# Patient Record
Sex: Male | Born: 1985 | Race: Black or African American | Hispanic: No | Marital: Single | Smoking: Never smoker
Health system: Southern US, Community
[De-identification: ages and names within clinical notes are randomized; demographics above are authoritative.]

## PROBLEM LIST (undated history)

## (undated) DIAGNOSIS — I10 Essential (primary) hypertension: Secondary | ICD-10-CM

## (undated) DIAGNOSIS — F909 Attention-deficit hyperactivity disorder, unspecified type: Secondary | ICD-10-CM

## (undated) DIAGNOSIS — Q992 Fragile X chromosome: Secondary | ICD-10-CM

## (undated) DIAGNOSIS — T7840XA Allergy, unspecified, initial encounter: Secondary | ICD-10-CM

## (undated) HISTORY — DX: Allergy, unspecified, initial encounter: T78.40XA

## (undated) HISTORY — DX: Fragile x chromosome: Q99.2

## (undated) HISTORY — DX: Attention-deficit hyperactivity disorder, unspecified type: F90.9

## (undated) HISTORY — DX: Essential (primary) hypertension: I10

## (undated) HISTORY — PX: NO PAST SURGERIES: SHX2092

---

## 2011-11-22 ENCOUNTER — Ambulatory Visit: Payer: Self-pay | Admitting: Internal Medicine

## 2012-10-15 IMAGING — CR LEFT MIDDLE FINGER 2+V
1 series · 3 of 3 positions shown · non-contrast
Comparison: none

REASON FOR EXAM: swollen distal after [REDACTED] into it yesteday
COMMENTS:

PROCEDURE:     MDR - MDR FINGER MID 3RD DIGIT LT HA  - November 22, 2011  [DATE]
RESULT:     Images of the left third finger demonstrate no definite
fracture, dislocation or foreign body.

[Series 1: pa · 0.17mm/px · 3 of 3 slices shown]
[im 1/3]
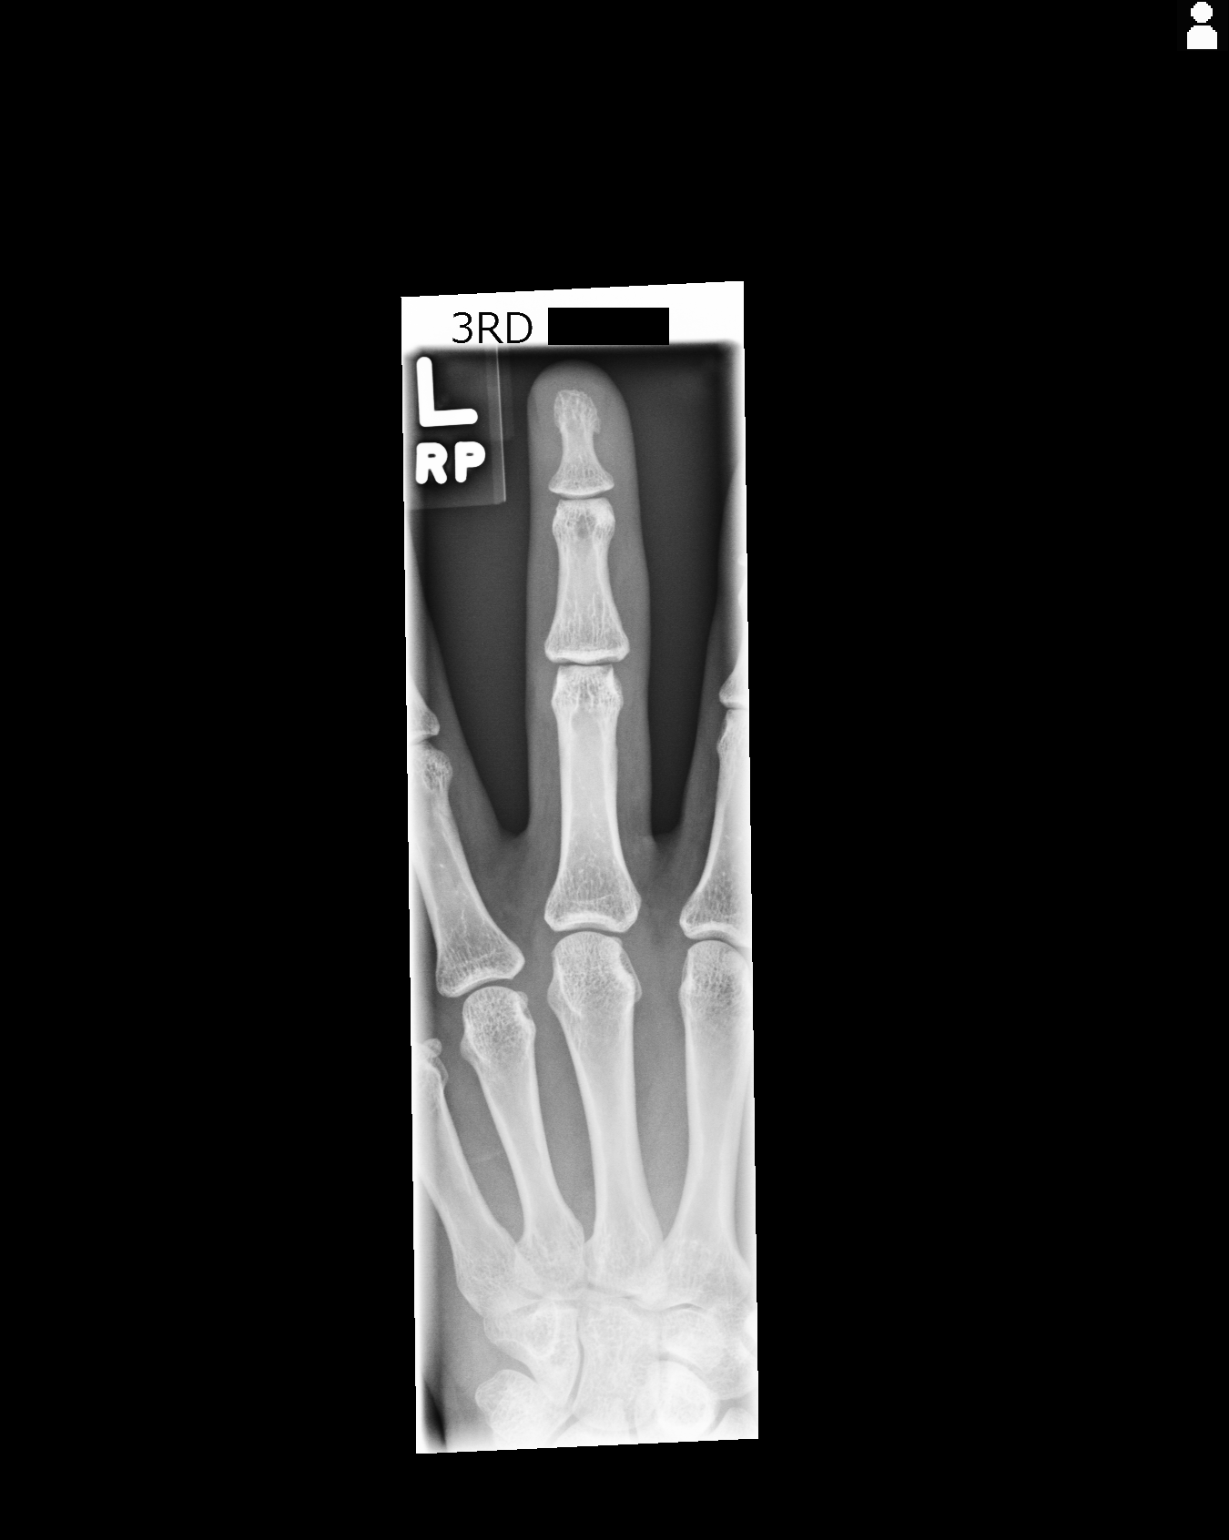
[im 2/3]
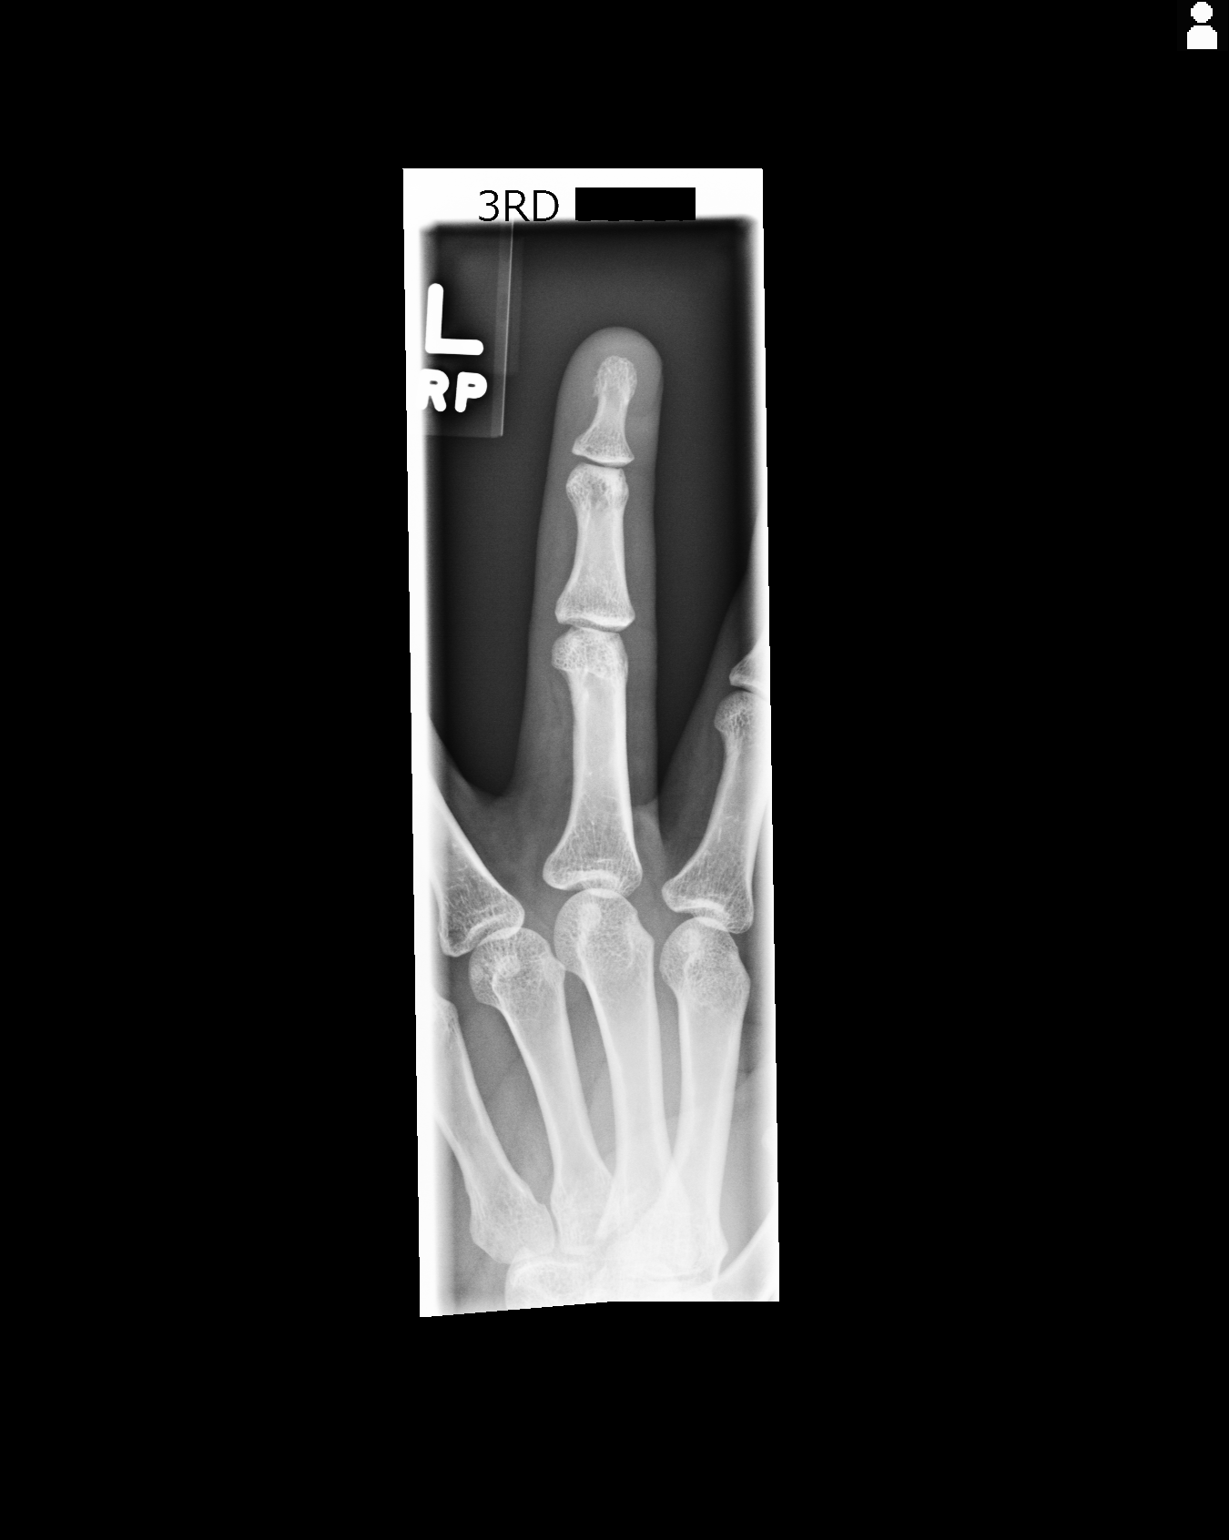
[im 3/3]
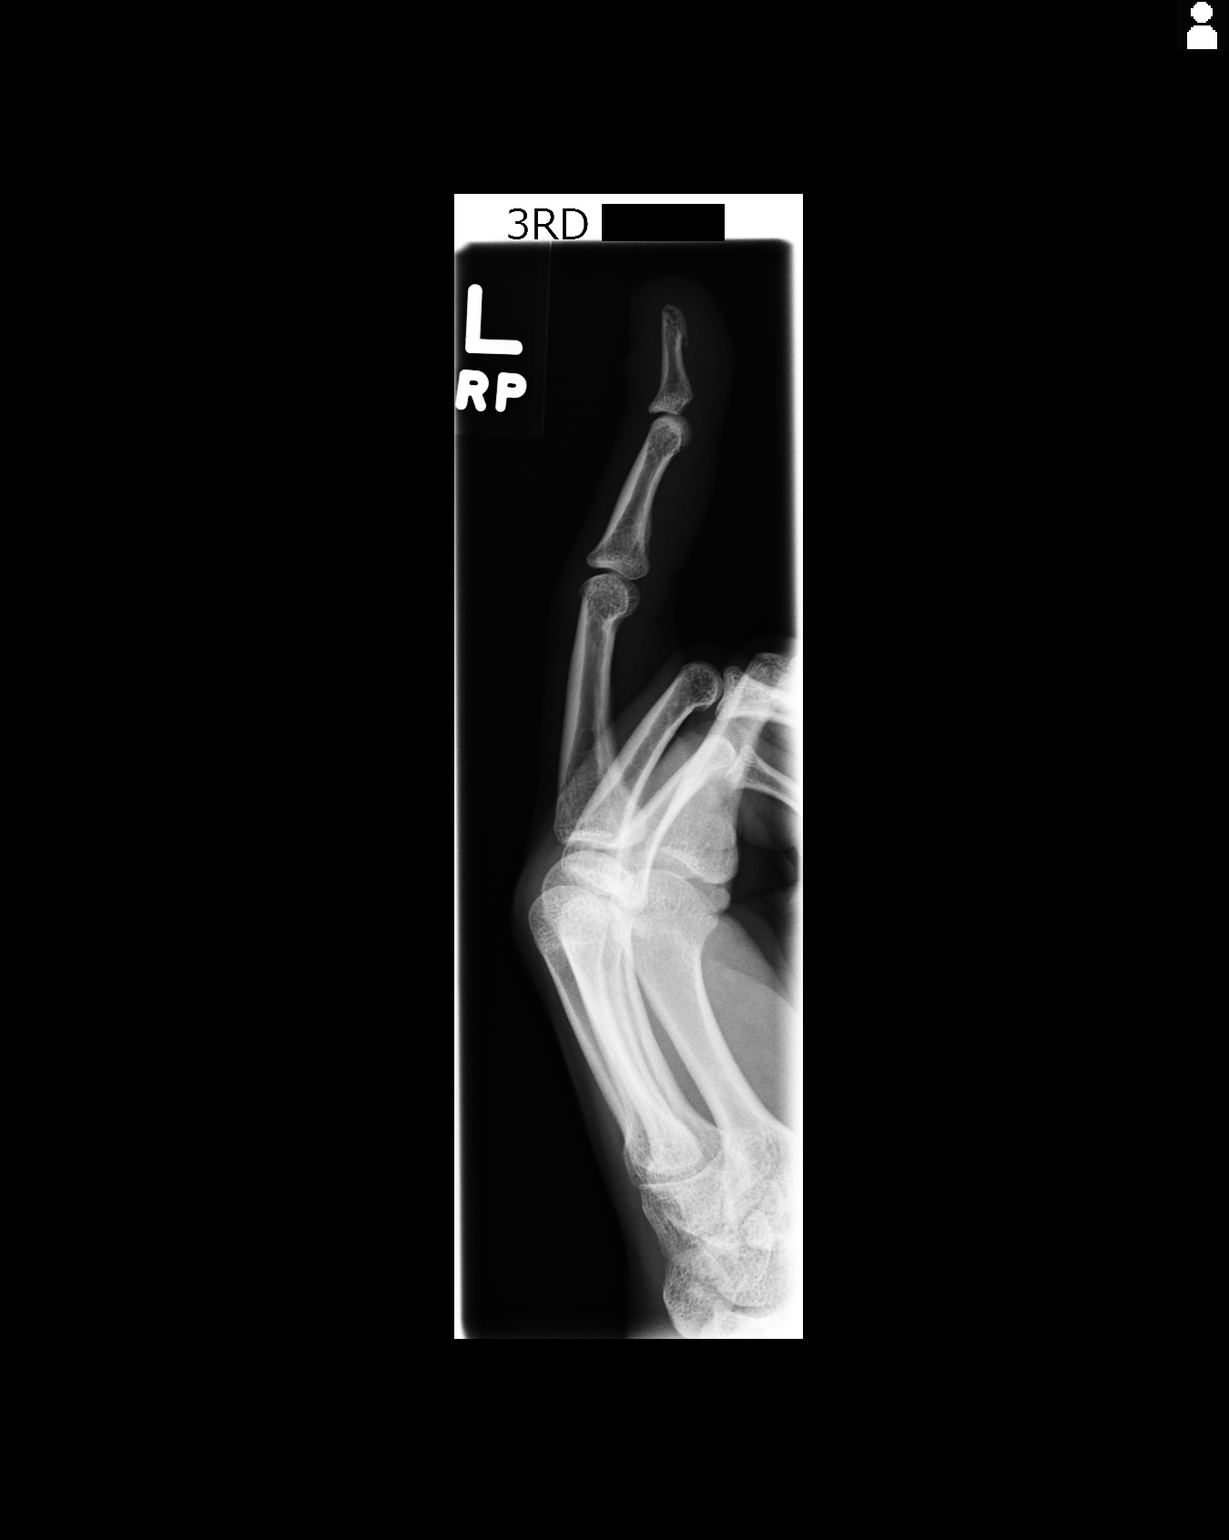

[3 of 3 positions shown; findings below may reference images not displayed]

IMPRESSION: No acute bony abnormality evident.

## 2017-12-25 ENCOUNTER — Other Ambulatory Visit: Payer: Self-pay

## 2017-12-25 ENCOUNTER — Ambulatory Visit: Payer: Medicaid Other | Admitting: Nurse Practitioner

## 2017-12-25 ENCOUNTER — Encounter: Payer: Self-pay | Admitting: Nurse Practitioner

## 2017-12-25 VITALS — BP 120/78 | HR 68 | Ht 69.0 in | Wt 156.2 lb

## 2017-12-25 DIAGNOSIS — Q992 Fragile X chromosome: Secondary | ICD-10-CM

## 2017-12-25 DIAGNOSIS — Z7689 Persons encountering health services in other specified circumstances: Secondary | ICD-10-CM | POA: Diagnosis not present

## 2017-12-25 DIAGNOSIS — F902 Attention-deficit hyperactivity disorder, combined type: Secondary | ICD-10-CM

## 2017-12-25 DIAGNOSIS — I10 Essential (primary) hypertension: Secondary | ICD-10-CM | POA: Diagnosis not present

## 2017-12-25 MED ORDER — LISINOPRIL 20 MG PO TABS: 20.0000 mg | ORAL_TABLET | Freq: Every day | ORAL | 1 refills | Status: DC

## 2017-12-25 MED ORDER — METHYLPHENIDATE HCL ER 36 MG PO TB24: 36.0000 mg | ORAL_TABLET | Freq: Every day | ORAL | 0 refills | Status: DC

## 2017-12-25 NOTE — Patient Instructions (Addendum)
Rhyker Dowdell,   Thank you for coming in to clinic today.  1. Vocational Rehab is a way to work to find employment that will allow you to work despite your disability. - [place] [phone]  2. Continue your medication without changes.   Resume Concerta.  Recommend repeat ADHD evaluation and behavioral therapies with psychology.  1. Drema Balzarine PHD, local Psychologist  [address] [place], [place] [address]  [phone]  2. [facility]  [address]   [place], [place] [address]   Phone [phone]   3. [facility] (will also do medication management)  Ground Floor of the [facility] just off the lobby  [address]  [place], [place] [address]  Phone: [phone]    Please schedule a follow-up appointment with [name], AGNP. Return in about 1 month (around 01/25/2018) for ADHD, Fragile X.  If you have any other questions or concerns, please feel free to call the clinic or send a message through MyChart. You may also schedule an earlier appointment if necessary.  You will receive a survey after today's visit either digitally by e-mail or paper by [employer]. Your experiences and feedback matter to [place].  Please respond so we know how we are doing as we provide care for you.   [name], DNP, AGNP-BC Adult Gerontology Nurse Practitioner [facility], [facility]

## 2017-12-25 NOTE — Progress Notes (Signed)
Subjective:    Patient ID: Ian Lam, male    DOB: 05/19/1986, 32 y.o.   MRN: [id number]  Ian Lam is a 32 y.o. male presenting on 12/25/2017 for Establish Care   HPI  Establish Care New Provider Pt last seen by PCP about 2 years ago.  Obtained records from Dr. Maryellen Pile prior to visit and are reviewed.  Last visit was in 2017.  Pt has history of Fragile X syndrome and has associated ADD/ADHD.  He has been managed on Concerta 36 mg once daily.    Fragile X Syndrome Patient is currently very communicative and participates in socialization mostly with his cousin.  He does not currently hold a job, but states he would like to hold a job if possible.  He is currently out of medication (Concerta) and notes he is very inattentive and has difficulty staying still.  Hypertension - He is not checking BP at home or outside of clinic.    - Current medications: lisinopril 10 mg once daily, tolerating well without side effects - He is not currently symptomatic. - Pt denies headache, lightheadedness, dizziness, changes in vision, chest tightness/pressure, palpitations, leg swelling, sudden loss of speech or loss of consciousness.   Past Medical History:  Diagnosis Date  . ADHD   . Allergy   . 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 Hypertension    Past Surgical History:  Procedure Laterality Date  . NO PAST SURGERIES       Social History   Socioeconomic History  . Marital status: Single    Spouse name: Not on file  . Number of children: Not on file  . Years of education: 55  . Highest education level: Not on file  Occupational History  . Not on file  Social Needs  . Financial resource strain: Not on file  . Food insecurity:    Worry: Not on file    Inability: Not on file  . Transportation needs:    Medical: Not on file    Non-medical: Not on file  Tobacco Use  . Smoking status: Never Smoker  . Smokeless tobacco: Never Used  Substance and Sexual Activity  . Alcohol use: Never     Frequency: Never  . Drug use: Never  . Sexual activity: Not Currently  Lifestyle  . Physical activity:    Days per week: Not on file    Minutes per session: Not on file  . Stress: Not on file  Relationships  . Social connections:    Talks on phone: Not on file    Gets together: Not on file    Attends religious service: Not on file    Active member of club or organization: Not on file    Attends meetings of clubs or organizations: Not on file    Relationship status: Not on file  . Intimate partner violence:    Fear of current or ex partner: Not on file    Emotionally abused: Not on file    Physically abused: Not on file    Forced sexual activity: Not on file  Other Topics Concern  . Not on file  Social History Narrative  . Not on file   Family History  Problem Relation Age of Onset  . Fragile X syndrome Brother    Current Outpatient Medications on File Prior to Visit  Medication Sig  . loratadine (CLARITIN) 10 MG tablet Take 10 mg by mouth daily.   No current facility-administered medications on file prior  to visit.     Review of Systems  Constitutional: Negative for activity change, fatigue and unexpected weight change.  HENT: Negative.   Eyes: Negative.   Respiratory: Negative for choking and shortness of breath.   Gastrointestinal: Negative for abdominal pain.  Endocrine: Negative.   Genitourinary: Negative for difficulty urinating.  Musculoskeletal: Negative for arthralgias, back pain and myalgias.  Skin: Negative.   Allergic/Immunologic: Positive for environmental allergies.  Neurological: Negative for dizziness, tremors and headaches.  Hematological: Negative for adenopathy. Does not bruise/bleed easily.  Psychiatric/Behavioral: Positive for decreased concentration. Negative for self-injury and sleep disturbance. The patient is hyperactive.    Per HPI unless specifically indicated above      Objective:    BP 120/78 (BP Location: Left Arm, Patient  Position: Sitting, Cuff Size: Normal)   Pulse 68   Ht  (1.753 m)   Wt 156 lb 3.2 oz (70.9 kg)   BMI 23.07 kg/m   Wt Readings from Last 3 Encounters:  12/25/17 156 lb 3.2 oz (70.9 kg)    Physical Exam  Constitutional: He is oriented to person, place, and time. He appears well-developed and well-nourished. No distress.  HENT:  Head: Normocephalic and atraumatic.  Neck: Normal range of motion. Neck supple.  Cardiovascular: Normal rate, regular rhythm, S1 normal, S2 normal, normal heart sounds and intact distal pulses.  Pulmonary/Chest: Effort normal and breath sounds normal. No respiratory distress.  Abdominal: Soft. Bowel sounds are normal. He exhibits no distension. There is no tenderness.  Neurological: He is alert and oriented to person, place, and time. He has normal strength and normal reflexes. No cranial nerve deficit or sensory deficit. He displays a negative Romberg sign. Gait normal.  Skin: Skin is warm and dry. Capillary refill takes less than 2 seconds.  Psychiatric: He has a normal mood and affect. His behavior is normal. Thought content normal. His speech is delayed. He is communicative.  Vitals reviewed.   No results found for this or any previous visit.    Assessment & Plan:   Problem List Items Addressed This Visit    None    Visit Diagnoses    Fragile X syndrome    -  Primary Currently well managed on Concerta 36 mg once daily.  Patient has been out for several days and is currently experiencing symptoms of ADHD.  He is not well-integrated for vocational rehab programs and would like to begin work if possible.  Currently exhibits no neurological signs of prior looks complications to include tremor or neurological exam deficits.  Plan: 1.  Continue methylphenidate 36 mg p.o. daily 2.  Provided information about vocational rehab program for [facility].  Provided patient from [place] office for patient to call for additional information 3.  Follow-up 4  weeks for methylphenidate prescription.  Will consider moving to 63-month prescriptions at that time.   Relevant Medications   methylphenidate 36 MG PO CR tablet   Attention deficit hyperactivity disorder (ADHD), combined type       Relevant Medications   methylphenidate 36 MG PO CR tablet   Essential hypertension     Currently well-controlled hypertension.  Patient is taking lisinopril 20 mg once daily and tolerating very well.  No currently identified complications.  Plan: 1.  Continue lisinopril 20 mg once daily 2.  Collect BMP for kidney function assessment today. 3.  Encouraged regular physical activity and heart healthy diet. 4.  Follow-up 4 weeks.   Relevant Medications   lisinopril (PRINIVIL,ZESTRIL) 20 MG tablet  Other Relevant Orders   BASIC METABOLIC PANEL WITH GFR   Encounter to establish care     Previous PCP was at Dr Maryellen Pile.  Records are reviewed requested.  Past medical, family, and surgical history reviewed w/ pt in clinic today.      Meds ordered this encounter  Medications  . methylphenidate 36 MG PO CR tablet    Sig: Take 1 tablet (36 mg total) by mouth daily.    Dispense:  30 tablet    Refill:  0    Order Specific Question:   Supervising Provider    Answer:   Smitty Cords [[id number]]  . lisinopril (PRINIVIL,ZESTRIL) 20 MG tablet    Sig: Take 1 tablet (20 mg total) by mouth daily.    Dispense:  90 tablet    Refill:  1    Order Specific Question:   Supervising Provider    Answer:   Smitty Cords [[id number]]     Follow up plan: Return in about 1 month (around 01/25/2018) for ADHD, Fragile X.  [name], DNP, AGPCNP-BC Adult Gerontology Primary Care Nurse Practitioner [facility] 12/25/2017, 6:04 PM

## 2018-01-06 ENCOUNTER — Telehealth: Payer: Self-pay | Admitting: Nurse Practitioner

## 2018-01-06 NOTE — Telephone Encounter (Signed)
Ian Lam was notified that we will discuss this concern at the patient f/u visit and fill out necessary paperwork. She verbalize understanding. She also gave me [facility] contact information.   I called and spoke w/ Ian Lam who informed me that we need to fill out a form for [facility] services and fax it over to [employer]. They will arrange a nurse assessment at the patient home and determine if the patient qualifies and how many hours. [employer] [phone].

## 2018-01-06 NOTE — Telephone Encounter (Signed)
Ian Lam said medicaid needs to be contacted to start home care services with [facility].  Her call back number is [phone]

## 2018-01-26 ENCOUNTER — Ambulatory Visit: Payer: Medicaid Other | Admitting: Nurse Practitioner

## 2018-01-26 ENCOUNTER — Encounter: Payer: Self-pay | Admitting: Nurse Practitioner

## 2018-01-26 ENCOUNTER — Other Ambulatory Visit: Payer: Self-pay

## 2018-01-26 DIAGNOSIS — F902 Attention-deficit hyperactivity disorder, combined type: Secondary | ICD-10-CM | POA: Diagnosis not present

## 2018-01-26 DIAGNOSIS — Q992 Fragile X chromosome: Secondary | ICD-10-CM | POA: Diagnosis not present

## 2018-01-26 NOTE — Patient Instructions (Addendum)
Ian Lam,   Thank you for coming in to clinic today.  1. THE MOST IMPORTANT THING TO REMEMBER: If we are continuing your Concerta, we are the ONLY people who can prescribe this for you.  DO NOT go to other doctors for this medicine.  Please take your controlled substance contract and also have Ms. [name] sign it.   2. We need to have you evaluated by Neurology after your next visit.  [name] will need to go with you.     Please schedule a follow-up appointment with Wilhelmina McardleLauren Nekayla Heider, AGNP. Return in about 3 months (around 04/28/2018) for ADHD, Fragile X.  If you have any other questions or concerns, please feel free to call the clinic or send a message through MyChart. You may also schedule an earlier appointment if necessary.  You will receive a survey after today's visit either digitally by e-mail or paper by [employer] mail. Your experiences and feedback matter to us.  Please respond so we know how we are doing as we provide care for you.   Wilhelmina McardleLauren Presli Fanguy, DNP, AGNP-BC Adult Gerontology Nurse Practitioner [facility], [facility]

## 2018-01-26 NOTE — Progress Notes (Signed)
   Subjective:    Patient ID: Ian Lam, male    DOB: 20-Apr-1986, 32 y.o.   MRN: [id number]  Ian Lam is a 32 y.o. male presenting on 01/26/2018 for ADHD   HPI Fragile X / ADHD Patient presents today for followup of Fragile X and ADHD, but is not accompanied by his primary caregiver, Ian Lam. Patient states he feels well and has no concerns today.  He reports he is excited about possibly finding a job.  He is not currently contributing to any work outside the home.  Patient reports stable appetite and has stable weight.  Reports no side effects from medication.  PCS request Pt does not qualify.  States he dresses, bathes, feeds, toilets himself without assistance.   Social History   Tobacco Use  . Smoking status: Never Smoker  . Smokeless tobacco: Never Used  Substance Use Topics  . Alcohol use: Never    Frequency: Never  . Drug use: Never    Review of Systems Per HPI unless specifically indicated above     Objective:    BP 112/61 (BP Location: Right Arm, Patient Position: Sitting, Cuff Size: Normal)   Pulse 79   Ht 5\' 9"  (1.753 m)   Wt 151 lb 12.8 oz (68.9 kg)   BMI 22.42 kg/m   Wt Readings from Last 3 Encounters:  01/26/18 151 lb 12.8 oz (68.9 kg)  12/25/17 156 lb 3.2 oz (70.9 kg)    Physical Exam  Constitutional: He is oriented to person, place, and time. He appears well-developed and well-nourished. No distress.  HENT:  Head: Normocephalic and atraumatic.  Cardiovascular: Normal rate, regular rhythm, S1 normal, S2 normal, normal heart sounds and intact distal pulses.  Pulmonary/Chest: Effort normal and breath sounds normal. No respiratory distress.  Neurological: He is alert and oriented to person, place, and time.  No tremor noted  Skin: Skin is warm and dry. Capillary refill takes less than 2 seconds.  Psychiatric: He has a normal mood and affect. His behavior is normal.  Vitals reviewed.      Assessment & Plan:   Problem List Items  Addressed This Visit    None    Visit Diagnoses    Fragile X syndrome       Attention deficit hyperactivity disorder (ADHD), combined type        Stable today on exam.  Medications tolerated without side effects.  Continue at current doses.  Refills provided.  Provided controlled substance contract for caregiver to sign as responsible party for prescription medications. Also provided vocational rehab and training information for [facility] [place].  Consider neurology referral in future.  Need confirmation from Ian Lam prior to referral that appointment will be completed with her accompanying patient.  Followup 3 months.    Ian Lam called clinic after visit for update.  Provided update and will complete PCS form for agency evaluation.  Ian Lam reports patient needs assistance with bathing and preparing all foods.  Follow up plan: Return in about 3 months (around 04/28/2018) for ADHD, Fragile X.  Wilhelmina McardleLauren Jeyren Danowski, DNP, AGPCNP-BC Adult Gerontology Primary Care Nurse Practitioner [facility] 01/26/2018, 10:24 AM

## 2018-02-20 ENCOUNTER — Telehealth: Payer: Self-pay

## 2018-02-20 ENCOUNTER — Other Ambulatory Visit: Payer: Self-pay

## 2018-02-20 DIAGNOSIS — Q992 Fragile X chromosome: Secondary | ICD-10-CM

## 2018-02-20 DIAGNOSIS — F902 Attention-deficit hyperactivity disorder, combined type: Secondary | ICD-10-CM

## 2018-02-20 MED ORDER — METHYLPHENIDATE HCL ER 36 MG PO TB24
36.0000 mg | ORAL_TABLET | Freq: Every day | ORAL | 0 refills | Status: DC
Start: 2018-02-23 — End: 2018-04-28

## 2018-02-20 MED ORDER — METHYLPHENIDATE HCL ER 36 MG PO TB24: 36.0000 mg | ORAL_TABLET | Freq: Every day | ORAL | 0 refills | Status: DC

## 2018-02-20 NOTE — Telephone Encounter (Signed)
Pt's guardian called clinic this morning.  There are no existing fills of concerta remaining.  - Last office visit 01/26/2018.  Agreed at that time to continue for 3 months. -Pt had fill on 6/1 by Dr. Omelia BlackwaterHeaden.  No refill written at visit as guardian was not present.   - Call placed to guardian expressing that ONLY one prescriber is allowed.  Guardian requested to sign controlled substance agreement that was sent home with son and verbalized she would do so.  No agreement received yet. For any ongoing fills, MUST have agreement signed. - Call to pharmacy.  All prior on hold prescriptions were cancelled verbally on 01/26/2018.   - 2 Refills sent today.  Pt will have to have visit every 3 months.  No additional early fills will be made.

## 2018-03-03 NOTE — Telephone Encounter (Signed)
Open in error

## 2018-04-06 ENCOUNTER — Encounter: Payer: Self-pay | Admitting: Nurse Practitioner

## 2018-04-06 ENCOUNTER — Ambulatory Visit (INDEPENDENT_AMBULATORY_CARE_PROVIDER_SITE_OTHER): Payer: Medicaid Other | Admitting: Nurse Practitioner

## 2018-04-06 ENCOUNTER — Other Ambulatory Visit: Payer: Self-pay

## 2018-04-06 VITALS — BP 118/73 | HR 71 | Ht 69.0 in | Wt 149.6 lb

## 2018-04-06 DIAGNOSIS — Z9109 Other allergy status, other than to drugs and biological substances: Secondary | ICD-10-CM | POA: Insufficient documentation

## 2018-04-06 DIAGNOSIS — Z79899 Other long term (current) drug therapy: Secondary | ICD-10-CM | POA: Diagnosis not present

## 2018-04-06 DIAGNOSIS — I10 Essential (primary) hypertension: Secondary | ICD-10-CM | POA: Insufficient documentation

## 2018-04-06 DIAGNOSIS — F909 Attention-deficit hyperactivity disorder, unspecified type: Secondary | ICD-10-CM | POA: Insufficient documentation

## 2018-04-06 DIAGNOSIS — F902 Attention-deficit hyperactivity disorder, combined type: Secondary | ICD-10-CM | POA: Diagnosis not present

## 2018-04-06 DIAGNOSIS — Q992 Fragile X chromosome: Secondary | ICD-10-CM

## 2018-04-06 DIAGNOSIS — Q8789 Other specified congenital malformation syndromes, not elsewhere classified: Secondary | ICD-10-CM | POA: Diagnosis not present

## 2018-04-06 MED ORDER — LISINOPRIL 20 MG PO TABS: 20.0000 mg | ORAL_TABLET | Freq: Every day | ORAL | 1 refills | Status: DC

## 2018-04-06 NOTE — Assessment & Plan Note (Addendum)
Currently stable on medications.  Patient with hypertension as possible side effect /complication from Concerta.  Patient without family caregiver present today for contribution to assessment/history.  No controlled substance contract on file.  Plan: 1. Labs today for CBC to monitor long-term use Concerta 2. Continue Concerta 36 mg once daily.  New Rx only once contract has been signed by responsible party, Donnel SaxonSheila Newborn. Consider dose decrease in future as patient has Hypertension.  Need to discuss more daily symptoms with [name] prior to change in dose. 3. Followup 3 months.  Consider neurology/psychiatry referral in future for management if worsening symptoms.

## 2018-04-06 NOTE — Patient Instructions (Addendum)
Ian Lam,   Thank you for coming in to clinic today.  1. Jemari is on a good, stable dose of Concerta.  Continue Concerta 36 mg once daily.  Prescription will only be sent after  non-opioid controlled substance contract is signed.  2. Financial controllerContact social worker for vocational training/placement.  Please schedule a follow-up appointment with Wilhelmina McardleLauren Zayin Valadez, AGNP. Return in about 3 months (around 07/07/2018) for ADHD, Fragile X.  If you have any other questions or concerns, please feel free to call the clinic or send a message through MyChart. You may also schedule an earlier appointment if necessary.  You will receive a survey after today's visit either digitally by e-mail or paper by [employer] mail. Your experiences and feedback matter to us.  Please respond so we know how we are doing as we provide care for you.   Wilhelmina McardleLauren Elson Ulbrich, DNP, AGNP-BC Adult Gerontology Nurse Practitioner [facility], [facility]

## 2018-04-06 NOTE — Progress Notes (Signed)
Subjective:    Patient ID: Ian Lam, male    DOB: 03-15-1986, 32 y.o.   MRN: [id number]  Ian Lam is a 32 y.o. male presenting on 04/06/2018 for ADHD   HPI ADHD/ Fragile X His caregiver is not accompanying him today as was requested at last visit.  Patient is verbal and is able to communicate the HPI that is documented today.  Patient is taking Concerta 36 mg once daily for ADHD, inattention and hyperactivity with good results per pt.  He is not having any picking, negative habits.  He has had no behavioral outbursts per patient report. - Patient reports he gets assistance with bathing, but clothes himself.  Someone else prepares foods, feeds himself.  He toilets himself and requires no assistance with ambulation. - Ms. [name] (his guardian) manages his medications.  She is not here with him in clinic today as was verbally agreed upon prior to the visit.  She also has not signed controlled substance contract as requested at last visit. - Patient states there is no progress toward getting job, but states "I'm trying to find me one now."  Patient states he has a Child psychotherapistsocial worker, but is not getting any current assistance.  Is very active outside during day.  Walks per patient report.   Hypertension - He is not checking BP at home or outside of clinic.    - Current medications: lisinopril 20 mg once daily, tolerating well without side effects - He is not currently symptomatic. - Pt denies headache, lightheadedness, dizziness, changes in vision, chest tightness/pressure, palpitations, leg swelling, sudden loss of speech or loss of consciousness.  Social History   Tobacco Use  . Smoking status: Never Smoker  . Smokeless tobacco: Never Used  Substance Use Topics  . Alcohol use: Never    Frequency: Never  . Drug use: Never    Review of Systems Per HPI unless specifically indicated above     Objective:    BP 118/73 (BP Location: Right Arm, Patient Position: Sitting, Cuff  Size: Normal)   Pulse 71   Ht 5\' 9"  (1.753 m)   Wt 149 lb 9.6 oz (67.9 kg)   BMI 22.09 kg/m   Wt Readings from Last 3 Encounters:  04/06/18 149 lb 9.6 oz (67.9 kg)  01/26/18 151 lb 12.8 oz (68.9 kg)  12/25/17 156 lb 3.2 oz (70.9 kg)    Physical Exam  Constitutional: He is oriented to person, place, and time. He appears well-developed and well-nourished. No distress.  HENT:  Head: Normocephalic and atraumatic.  Cardiovascular: Normal rate, regular rhythm, S1 normal, S2 normal, normal heart sounds and intact distal pulses.  Pulmonary/Chest: Effort normal and breath sounds normal. No respiratory distress.  Abdominal: Soft. He exhibits no distension. Bowel sounds are increased. There is no hepatosplenomegaly. There is no tenderness. No hernia.  Neurological: He is alert and oriented to person, place, and time.  Skin: Skin is warm and dry.  Psychiatric: He has a normal mood and affect. His behavior is normal.  Vitals reviewed.     Assessment & Plan:   Problem List Items Addressed This Visit      Cardiovascular and Mediastinum   Hypertension    Controlled hypertension on exam today.  Possibly essential hypertension vs drug induced hypertension r/t Concerta.  BP goal < 130/80.  Pt is not working on lifestyle modifications as he is unable to change food prepared for him by his guardian.  Taking medications tolerating well without side effects. No  currently identified complications.  Plan: 1. Continue taking lisinopril 20 mg once daily 2. Obtain labs today  3. Encouraged heart healthy diet and increasing exercise to 30 minutes most days of the week. 4. Follow up 3 months.        Relevant Medications   lisinopril (PRINIVIL,ZESTRIL) 20 MG tablet   Other Relevant Orders   BASIC METABOLIC PANEL WITH GFR     Other   Fragile X syndrome in male - Primary    Stable chronic condition with mental retardation.  Patient is a good candidate for vocational rehabilitation and should be  integrated into vocational rehab program to have integration into the community and potential for income source.  - Encouraged patient to seek assistance from [employer] [facility] and a Child psychotherapistsocial worker for [employer].  Information previously provided. - Followup prn.  Consider neurology referral in future.      Relevant Orders   CBC with Differential   ADHD    Currently stable on medications.  Patient with hypertension as possible side effect /complication from Concerta.  Patient without family caregiver present today for contribution to assessment/history.  No controlled substance contract on file.  Plan: 1. Labs today for CBC to monitor long-term use Concerta 2. Continue Concerta 36 mg once daily.  New Rx only once contract has been signed by responsible party, Donnel SaxonSheila Banales. Consider dose decrease in future as patient has Hypertension.  Need to discuss more daily symptoms with [name] prior to change in dose. 3. Followup 3 months.  Consider neurology/psychiatry referral in future for management if worsening symptoms.      Relevant Orders   CBC with Differential    Other Visit Diagnoses    High risk medication use       Relevant Orders   BASIC METABOLIC PANEL WITH GFR   CBC with Differential      Meds ordered this encounter  Medications  . lisinopril (PRINIVIL,ZESTRIL) 20 MG tablet    Sig: Take 1 tablet (20 mg total) by mouth daily.    Dispense:  90 tablet    Refill:  1    Order Specific Question:   Supervising Provider    Answer:   Smitty CordsKARAMALEGOS, ALEXANDER J [[id number]]    Follow up plan: Return in about 3 months (around 07/07/2018) for ADHD, Fragile X.  Wilhelmina McardleLauren Navy Belay, DNP, AGPCNP-BC Adult Gerontology Primary Care Nurse Practitioner [facility] 04/06/2018, 10:27 AM

## 2018-04-06 NOTE — Assessment & Plan Note (Signed)
Stable chronic condition with mental retardation.  Patient is a good candidate for vocational rehabilitation and should be integrated into vocational rehab program to have integration into the community and potential for income source.  - Encouraged patient to seek assistance from [employer] and a Child psychotherapistsocial worker for [employer].  Information previously provided. - Followup prn.  Consider neurology referral in future.

## 2018-04-06 NOTE — Assessment & Plan Note (Signed)
Controlled hypertension on exam today.  Possibly essential hypertension vs drug induced hypertension r/t Concerta.  BP goal < 130/80.  Pt is not working on lifestyle modifications as he is unable to change food prepared for him by his guardian.  Taking medications tolerating well without side effects. No currently identified complications.  Plan: 1. Continue taking lisinopril 20 mg once daily 2. Obtain labs today  3. Encouraged heart healthy diet and increasing exercise to 30 minutes most days of the week. 4. Follow up 3 months.

## 2018-04-28 ENCOUNTER — Telehealth: Payer: Self-pay | Admitting: Nurse Practitioner

## 2018-04-28 ENCOUNTER — Ambulatory Visit: Payer: Medicaid Other | Admitting: Nurse Practitioner

## 2018-04-28 DIAGNOSIS — F902 Attention-deficit hyperactivity disorder, combined type: Secondary | ICD-10-CM

## 2018-04-28 DIAGNOSIS — Q992 Fragile X chromosome: Secondary | ICD-10-CM

## 2018-04-28 MED ORDER — METHYLPHENIDATE HCL ER (OSM) 36 MG PO TBCR: 36.0000 mg | EXTENDED_RELEASE_TABLET | Freq: Every day | ORAL | 0 refills | Status: DC

## 2018-04-28 MED ORDER — METHYLPHENIDATE HCL ER 36 MG PO TB24: 36.0000 mg | ORAL_TABLET | Freq: Every day | ORAL | 0 refills | Status: DC

## 2018-04-28 NOTE — Telephone Encounter (Signed)
Refills have been sent.  

## 2018-04-28 NOTE — Telephone Encounter (Signed)
Pt mother called requesting refill on methylphenidate 36 mg  called into [facility]. Mother call back  # is [phone]

## 2018-05-27 ENCOUNTER — Other Ambulatory Visit: Payer: Self-pay | Admitting: Nurse Practitioner

## 2018-05-27 NOTE — Telephone Encounter (Signed)
Pt needs refill on claritin sent to [facility] °

## 2018-05-28 MED ORDER — LORATADINE 10 MG PO TABS: 10.0000 mg | ORAL_TABLET | Freq: Every day | ORAL | 1 refills | Status: DC

## 2018-07-06 ENCOUNTER — Other Ambulatory Visit: Payer: Self-pay

## 2018-07-06 ENCOUNTER — Encounter: Payer: Self-pay | Admitting: Nurse Practitioner

## 2018-07-06 ENCOUNTER — Ambulatory Visit (INDEPENDENT_AMBULATORY_CARE_PROVIDER_SITE_OTHER): Payer: Medicaid Other | Admitting: Nurse Practitioner

## 2018-07-06 VITALS — BP 123/69 | HR 108 | Ht 69.0 in | Wt 155.0 lb

## 2018-07-06 DIAGNOSIS — S60211A Contusion of right wrist, initial encounter: Secondary | ICD-10-CM

## 2018-07-06 DIAGNOSIS — Z23 Encounter for immunization: Secondary | ICD-10-CM | POA: Diagnosis not present

## 2018-07-06 DIAGNOSIS — F902 Attention-deficit hyperactivity disorder, combined type: Secondary | ICD-10-CM

## 2018-07-06 DIAGNOSIS — Q992 Fragile X chromosome: Secondary | ICD-10-CM | POA: Diagnosis not present

## 2018-07-06 MED ORDER — METHYLPHENIDATE HCL ER 27 MG PO TB24: 27.0000 mg | ORAL_TABLET | Freq: Every day | ORAL | 0 refills | Status: DC

## 2018-07-06 MED ORDER — METHYLPHENIDATE HCL ER (OSM) 27 MG PO TBCR: 27.0000 mg | EXTENDED_RELEASE_TABLET | Freq: Every day | ORAL | 0 refills | Status: DC

## 2018-07-06 NOTE — Patient Instructions (Addendum)
Ian Lam,   Thank you for coming in to clinic today.  1. REDUCE Concerta dose to 27 mg once daily.  This should help reduce heart rate.  Let me know if there are any significant changes - behavior problems, not focusing on coloring and TV like he could before.  2. For your RIGHT wrist. - Start taking Tylenol extra strength 1 to 2 tablets every 6-8 hours for aches or fever/chills for next few days as needed.  Do not take more than 3,000 mg in 24 hours from all medicines.  May take Ibuprofen as well if tolerated 200-400mg  every 8 hours as needed. May alternate tylenol and ibuprofen in same day. - Use heat and ice.  Apply this for 15 minutes at a time 6-8 times per day.    - If you still have a raised bump in about 2 weeks, call clinic to schedule an Xray.  Please schedule a follow-up appointment with [name], AGNP. Return in about 3 months (around 10/06/2018) for ADHD/Fragile X.  If you have any other questions or concerns, please feel free to call the clinic or send a message through MyChart. You may also schedule an earlier appointment if necessary.  You will receive a survey after today's visit either digitally by e-mail or paper by [employer]. Your experiences and feedback matter to [place].  Please respond so we know how we are doing as we provide care for you.   [name], DNP, AGNP-BC Adult Gerontology Nurse Practitioner [facility], [facility]

## 2018-07-06 NOTE — Progress Notes (Signed)
Subjective:    Patient ID: Ian Lam, male    DOB: [birth year], 32 y.o.   MRN: [id number]  Ian Lam is a 32 y.o. male presenting on 07/06/2018 for ADHD and Wrist Injury (pt injured rt wrist x 1 week ago. He hit it on his bed. Pt reports the pain subsided. )   HPI  Fragile X, ADHD Patient continues to be very communicative.  Hypercommunication is resolved on Concerta. - Colors and watches TV during day. - Patient has attempted a vocational rehab program in past and was unsuccessful per patient's mother, Silvio Pate who accompanies patient today to the visit. -  No behavioral outbursts over last 3 months.  RIGHT Wrist injury Hit on side of bed, has hematoma.  No soreness or pain.  This occurred several days ago.  - He has not taken any medications for the injury.  Only notified his mother about this 2 days after initial injury.    Social History   Tobacco Use  . Smoking status: Never Smoker  . Smokeless tobacco: Never Used  Substance Use Topics  . Alcohol use: Never    Frequency: Never  . Drug use: Never    Review of Systems Per HPI unless specifically indicated above     Objective:    BP 123/69 (BP Location: Right Arm, Patient Position: Sitting, Cuff Size: Normal)   Pulse (!) 108   Ht 5\' 9"  (1.753 m)   Wt 155 lb (70.3 kg)   BMI 22.89 kg/m   Wt Readings from Last 3 Encounters:  07/06/18 155 lb (70.3 kg)  04/06/18 149 lb 9.6 oz (67.9 kg)  01/26/18 151 lb 12.8 oz (68.9 kg)    Physical Exam  Constitutional: He is oriented to person, place, and time. He appears well-developed and well-nourished. No distress.  HENT:  Head: Normocephalic and atraumatic.  Cardiovascular: Normal rate, regular rhythm, S1 normal, S2 normal, normal heart sounds and intact distal pulses.  Pulmonary/Chest: Effort normal and breath sounds normal. No respiratory distress.  Abdominal: Soft. He exhibits no distension. Bowel sounds are increased. There is no hepatosplenomegaly. There is no  tenderness. No hernia.  Musculoskeletal:       Right wrist: He exhibits swelling (Right Ulnar region hematoma without pain). He exhibits normal range of motion, no tenderness, no bony tenderness, no crepitus and no laceration.  Neurological: He is alert and oriented to person, place, and time.  Skin: Skin is warm and dry.  Psychiatric: He has a normal mood and affect. His speech is normal and behavior is normal.  Impaired cognition with mild to moderate delay.  Vitals reviewed.     Assessment & Plan:   Problem List Items Addressed This Visit      Other   ADHD See AP Fragile X below.   Relevant Medications   methylphenidate 27 MG PO CR tablet (Start on 07/22/2018)   methylphenidate 27 MG PO TB24 (Start on 09/20/2018)   methylphenidate 27 MG PO TB24 (Start on 08/21/2018)    Other Visit Diagnoses    Fragile X syndrome    -  Primary Patient with chronic cognitive delay since birth.  Rarely has behavioral outburts, but has hyperactivity and decreased attention.  No recent neurological visit.  No significant complications of Fragile X currently.  Plan: 1. Reduce Concerta to 27 mg once daily.  Need to use lowest necessary dose.   2. Continue with daily activities, encouraged patient to become responsible for more chores around the home. 3. Consider future neurology  appointment.   4. Follow-up 3 months.   Relevant Medications   methylphenidate 27 MG PO CR tablet (Start on 07/22/2018)   methylphenidate 27 MG PO TB24 (Start on 09/20/2018)   methylphenidate 27 MG PO TB24 (Start on 08/21/2018)   Traumatic hematoma of right wrist, initial encounter     Unknown date of injury, no pain.  Presentation is most consistent with hematoma.    Plan: 1. Offered Xray, but declined by patient and mother as injury and swelling is non-painful.  Encouraged to followup if swelling persists > 2 weeks. 2. Encouraged patient to apply ice/heat 3. May take ibuprofen to help reduce swelling. 4. Follow-up prn.     Needs flu shot     Pt < age 36.  Needs annual influenza vaccine.  Plan: 1. Administer Quad flu vaccine.   Relevant Orders   Flu Vaccine QUAD 6+ mos PF IM (Fluarix Quad PF) (Completed)      Meds ordered this encounter  Medications  . DISCONTD: methylphenidate 27 MG PO TB24    Sig: Take 1 tablet (27 mg total) by mouth daily.    Dispense:  30 tablet    Refill:  0    Order Specific Question:   Supervising Provider    Answer:   Smitty Cords [[id number]]  . DISCONTD: methylphenidate 27 MG PO TB24    Sig: Take 1 tablet (27 mg total) by mouth daily.    Dispense:  30 tablet    Refill:  0    Order Specific Question:   Supervising Provider    Answer:   Smitty Cords [[id number]]  . DISCONTD: methylphenidate 27 MG PO CR tablet    Sig: Take 1 tablet (27 mg total) by mouth daily.    Dispense:  30 tablet    Refill:  0    Order Specific Question:   Supervising Provider    Answer:   Smitty Cords [[id number]]  . methylphenidate 27 MG PO CR tablet    Sig: Take 1 tablet (27 mg total) by mouth daily.    Dispense:  30 tablet    Refill:  0    Order Specific Question:   Supervising Provider    Answer:   Smitty Cords [[id number]]  . methylphenidate 27 MG PO TB24    Sig: Take 1 tablet (27 mg total) by mouth daily.    Dispense:  30 tablet    Refill:  0    Order Specific Question:   Supervising Provider    Answer:   Smitty Cords [[id number]]  . methylphenidate 27 MG PO TB24    Sig: Take 1 tablet (27 mg total) by mouth daily.    Dispense:  30 tablet    Refill:  0    Order Specific Question:   Supervising Provider    Answer:   Smitty Cords [[id number]]    Follow up plan: Return in about 3 months (around 10/06/2018) for ADHD/Fragile X.  [name], DNP, AGPCNP-BC Adult Gerontology Primary Care Nurse Practitioner [facility] 07/06/2018, 8:30 AM

## 2018-07-13 ENCOUNTER — Encounter: Payer: Self-pay | Admitting: Nurse Practitioner

## 2018-07-22 ENCOUNTER — Telehealth: Payer: Self-pay | Admitting: Nurse Practitioner

## 2018-07-22 NOTE — Telephone Encounter (Signed)
[facility] called need  Clarification on meed

## 2018-07-22 NOTE — Telephone Encounter (Signed)
[facility] to get clarification on pt  Medication.  Drug store call back   # is [phone]

## 2018-07-22 NOTE — Telephone Encounter (Signed)
Confirmed with Luster LandsbergRenee that patient will try lower dose Concerta.

## 2018-07-27 ENCOUNTER — Other Ambulatory Visit: Payer: Self-pay

## 2018-10-07 ENCOUNTER — Ambulatory Visit: Payer: Medicaid Other | Admitting: Nurse Practitioner

## 2018-10-12 ENCOUNTER — Other Ambulatory Visit: Payer: Self-pay

## 2018-10-12 ENCOUNTER — Ambulatory Visit (INDEPENDENT_AMBULATORY_CARE_PROVIDER_SITE_OTHER): Payer: Medicaid Other | Admitting: Nurse Practitioner

## 2018-10-12 ENCOUNTER — Encounter: Payer: Self-pay | Admitting: Nurse Practitioner

## 2018-10-12 VITALS — BP 133/69 | HR 89 | Temp 98.2°F | Ht 69.0 in | Wt 159.2 lb

## 2018-10-12 DIAGNOSIS — Z23 Encounter for immunization: Secondary | ICD-10-CM

## 2018-10-12 DIAGNOSIS — F902 Attention-deficit hyperactivity disorder, combined type: Secondary | ICD-10-CM | POA: Diagnosis not present

## 2018-10-12 DIAGNOSIS — Q992 Fragile X chromosome: Secondary | ICD-10-CM | POA: Diagnosis not present

## 2018-10-12 MED ORDER — METHYLPHENIDATE HCL ER (OSM) 27 MG PO TBCR: 27.0000 mg | EXTENDED_RELEASE_TABLET | Freq: Every day | ORAL | 0 refills | Status: DC

## 2018-10-12 MED ORDER — METHYLPHENIDATE HCL ER 27 MG PO TB24: 27.0000 mg | ORAL_TABLET | Freq: Every day | ORAL | 0 refills | Status: DC

## 2018-10-12 NOTE — Progress Notes (Signed)
Subjective:    Patient ID: Ian Lam, male    DOB: [birth year], 33 y.o.   MRN: [id number]  Ian Lam is a 33 y.o. male presenting on 10/12/2018 for ADHD   HPI  Fragile X, ADHD Patient continues to be very communicative.  Hypercommunication is resolved on Concerta.  Lower dose has continued to provide relief and patient is tolerating better.  Velna Hatchet, his guardian and aunt, is pleased with current treatment. -  No behavioral outbursts over last 3 months. - Continues to contribute some at home with chores.  Social History   Tobacco Use  . Smoking status: Never Smoker  . Smokeless tobacco: Never Used  Substance Use Topics  . Alcohol use: Never    Frequency: Never  . Drug use: Never    Review of Systems Per HPI unless specifically indicated above     Objective:    BP 133/69 (BP Location: Left Arm, Patient Position: Sitting, Cuff Size: Normal)   Pulse 89   Temp 98.2 F (36.8 C) (Oral)   Ht 5\' 9"  (1.753 m)   Wt 159 lb 3.2 oz (72.2 kg)   BMI 23.51 kg/m   Wt Readings from Last 3 Encounters:  10/12/18 159 lb 3.2 oz (72.2 kg)  07/06/18 155 lb (70.3 kg)  04/06/18 149 lb 9.6 oz (67.9 kg)    Physical Exam Vitals signs reviewed.  Constitutional:      General: He is not in acute distress.    Appearance: He is well-developed.  HENT:     Head: Normocephalic and atraumatic.  Cardiovascular:     Rate and Rhythm: Normal rate and regular rhythm.     Heart sounds: Normal heart sounds, S1 normal and S2 normal.  Pulmonary:     Effort: Pulmonary effort is normal. No respiratory distress.     Breath sounds: Normal breath sounds.  Abdominal:     General: Bowel sounds are increased. There is no distension.     Palpations: Abdomen is soft.     Tenderness: There is no abdominal tenderness.     Hernia: No hernia is present.  Skin:    General: Skin is warm and dry.  Neurological:     Mental Status: He is alert and oriented to person, place, and time.  Psychiatric:      Speech: Speech normal.        Behavior: Behavior normal.     Comments: Impaired cognition with mild to moderate delay.        Assessment & Plan:   Problem List Items Addressed This Visit      Other   ADHD   Relevant Medications   methylphenidate 27 MG PO TB24 (Start on 12/25/2018)   methylphenidate 27 MG PO CR tablet (Start on 11/25/2018)   methylphenidate 27 MG PO TB24 (Start on 10/26/2018)    Other Visit Diagnoses    Need for diphtheria-tetanus-pertussis (Tdap) vaccine    -  Primary   Relevant Orders   Tdap vaccine greater than or equal to 7yo IM (Completed)   Fragile X syndrome       Relevant Medications   methylphenidate 27 MG PO TB24 (Start on 12/25/2018)   methylphenidate 27 MG PO CR tablet (Start on 11/25/2018)   methylphenidate 27 MG PO TB24 (Start on 10/26/2018)      Patient with chronic cognitive delay due to fragile X and associated ADHD since birth.  Rarely has behavioral outburts, but has hyperactivity and decreased attention.  No recent neurological visit.  No  significant complications of Fragile X currently.  Plan: 1. Continue Concerta at 27 mg once daily.  Need to use lowest necessary dose. HR, BP improved since decreasing dose.   2. Continue with daily activities, encouraged patient to become responsible for more chores around the home. 3. Consider future neurology appointment.   4. Follow-up 3 months.  Meds ordered this encounter  Medications  . methylphenidate 27 MG PO TB24    Sig: Take 1 tablet (27 mg total) by mouth daily for 30 days.    Dispense:  30 tablet    Refill:  0    Order Specific Question:   Supervising Provider    Answer:   Smitty Cords [[id number]]  . methylphenidate 27 MG PO CR tablet    Sig: Take 1 tablet (27 mg total) by mouth daily for 30 days.    Dispense:  30 tablet    Refill:  0    Order Specific Question:   Supervising Provider    Answer:   Smitty Cords [[id number]]  . methylphenidate 27 MG PO TB24    Sig: Take 1 tablet (27  mg total) by mouth daily for 30 days.    Dispense:  30 tablet    Refill:  0    Order Specific Question:   Supervising Provider    Answer:   Smitty Cords [[id number]]    Follow up plan: Return in about 3 months (around 01/10/2019) for ADHD.  [name], DNP, AGPCNP-BC Adult Gerontology Primary Care Nurse Practitioner [facility] 10/12/2018, 10:00 AM

## 2018-10-12 NOTE — Patient Instructions (Addendum)
Ian Lam,   Thank you for coming in to clinic today.  1. Continue Concerta without changes.  Please schedule a follow-up appointment with [name], AGNP. Return in about 3 months (around 01/10/2019) for ADHD.  If you have any other questions or concerns, please feel free to call the clinic or send a message through MyChart. You may also schedule an earlier appointment if necessary.  You will receive a survey after today's visit either digitally by e-mail or paper by [employer]. Your experiences and feedback matter to [place].  Please respond so we know how we are doing as we provide care for you.   [name], DNP, AGNP-BC Adult Gerontology Nurse Practitioner [facility], [facility]    Tdap Vaccine (Tetanus, Diphtheria and Pertussis): What You Need to Know 1. Why get vaccinated? Tetanus, diphtheria and pertussis are very serious diseases. Tdap vaccine can protect [place] from these diseases. And, Tdap vaccine given to pregnant women can protect newborn babies against pertussis.Marland Kitchen TETANUS (Lockjaw) is rare in the [place] today. It causes painful muscle tightening and stiffness, usually all over the body.  It can lead to tightening of muscles in the head and neck so you can't open your mouth, swallow, or sometimes even breathe. Tetanus kills about 1 out of 10 people who are infected even after receiving the best medical care. DIPHTHERIA is also rare in the [place] today. It can cause a thick coating to form in the back of the throat.  It can lead to breathing problems, heart failure, paralysis, and death. PERTUSSIS (Whooping Cough) causes severe coughing spells, which can cause difficulty breathing, vomiting and disturbed sleep.  It can also lead to weight loss, incontinence, and rib fractures. Up to 2 in 100 adolescents and 5 in 100 adults with pertussis are hospitalized or have complications, which could include pneumonia or death. These diseases are caused by  bacteria. Diphtheria and pertussis are spread from person to person through secretions from coughing or sneezing. Tetanus enters the body through cuts, scratches, or wounds. Before vaccines, as many as 200,000 cases of diphtheria, 200,000 cases of pertussis, and hundreds of cases of tetanus, were reported in the [place] each year. Since vaccination began, reports of cases for tetanus and diphtheria have dropped by about 99% and for pertussis by about 80%. 2. Tdap vaccine Tdap vaccine can protect adolescents and adults from tetanus, diphtheria, and pertussis. One dose of Tdap is routinely given at age 61 or 21. People who did not get Tdap at that age should get it as soon as possible. Tdap is especially important for healthcare professionals and anyone having close contact with a baby younger than 12 months. Pregnant women should get a dose of Tdap during every pregnancy, to protect the newborn from pertussis. Infants are most at risk for severe, life-threatening complications from pertussis. Another vaccine, called Td, protects against tetanus and diphtheria, but not pertussis. A Td booster should be given every 10 years. Tdap may be given as one of these boosters if you have never gotten Tdap before. Tdap may also be given after a severe cut or burn to prevent tetanus infection. Your doctor or the person giving you the vaccine can give you more information. Tdap may safely be given at the same time as other vaccines. 3. Some people should not get this vaccine  A person who has ever had a life-threatening allergic reaction after a previous dose of any diphtheria, tetanus or pertussis containing vaccine, OR  has a severe allergy to any part of this vaccine, should not get Tdap vaccine. Tell the person giving the vaccine about any severe allergies.  Anyone who had coma or long repeated seizures within 7 days after a childhood dose of DTP or DTaP, or a previous dose of Tdap, should not get Tdap,  unless a cause other than the vaccine was found. They can still get Td.  Talk to your doctor if you: ? have seizures or another nervous system problem, ? had severe pain or swelling after any vaccine containing diphtheria, tetanus or pertussis, ? ever had a condition called Guillain-Barr Syndrome (GBS), ? aren't feeling well on the day the shot is scheduled. 4. Risks With any medicine, including vaccines, there is a chance of side effects. These are usually mild and go away on their own. Serious reactions are also possible but are rare. Most people who get Tdap vaccine do not have any problems with it. Mild problems following Tdap (Did not interfere with activities)  Pain where the shot was given (about 3 in 4 adolescents or 2 in 3 adults)  Redness or swelling where the shot was given (about 1 person in 5)  Mild fever of at least 100.62F (up to about 1 in 25 adolescents or 1 in 100 adults)  Headache (about 3 or 4 people in 10)  Tiredness (about 1 person in 3 or 4)  Nausea, vomiting, diarrhea, stomach ache (up to 1 in 4 adolescents or 1 in 10 adults)  Chills, sore joints (about 1 person in 10)  Body aches (about 1 person in 3 or 4)  Rash, swollen glands (uncommon) Moderate problems following Tdap (Interfered with activities, but did not require medical attention)  Pain where the shot was given (up to 1 in 5 or 6)  Redness or swelling where the shot was given (up to about 1 in 16 adolescents or 1 in 12 adults)  Fever over 102F (about 1 in 100 adolescents or 1 in 250 adults)  Headache (about 1 in 7 adolescents or 1 in 10 adults)  Nausea, vomiting, diarrhea, stomach ache (up to 1 or 3 people in 100)  Swelling of the entire arm where the shot was given (up to about 1 in 500). Severe problems following Tdap (Unable to perform usual activities; required medical attention)  Swelling, severe pain, bleeding and redness in the arm where the shot was given (rare). Problems that  could happen after any vaccine:  People sometimes faint after a medical procedure, including vaccination. Sitting or lying down for about 15 minutes can help prevent fainting, and injuries caused by a fall. Tell your doctor if you feel dizzy, or have vision changes or ringing in the ears.  Some people get severe pain in the shoulder and have difficulty moving the arm where a shot was given. This happens very rarely.  Any medication can cause a severe allergic reaction. Such reactions from a vaccine are very rare, estimated at fewer than 1 in a million doses, and would happen within a few minutes to a few hours after the vaccination. As with any medicine, there is a very remote chance of a vaccine causing a serious injury or death. The safety of vaccines is always being monitored. For more information, visit: http://floyd.org/ 5. What if there is a serious problem? What should I look for?  Look for anything that concerns you, such as signs of a severe allergic reaction, very high fever, or unusual behavior. Signs of a  severe allergic reaction can include hives, swelling of the face and throat, difficulty breathing, a fast heartbeat, dizziness, and weakness. These would usually start a few minutes to a few hours after the vaccination. What should I do?  If you think it is a severe allergic reaction or other emergency that can't wait, call 9-1-1 or get the person to the nearest hospital. Otherwise, call your doctor.  Afterward, the reaction should be reported to the Vaccine Adverse Event Reporting System (VAERS). Your doctor might file this report, or you can do it yourself through the VAERS web site at www.vaers.LAgents.nohhs.gov, or by calling 1-4420963332. VAERS does not give medical advice. 6. The National Vaccine Injury Compensation Program The [employer] Vaccine Injury Compensation Program (VICP) is a federal program that was created to compensate people who may have been injured by certain  vaccines. Persons who believe they may have been injured by a vaccine can learn about the program and about filing a claim by calling 1-(810)285-9607 or visiting the VICP website at SpiritualWord.atwww.hrsa.gov/vaccinecompensation. There is a time limit to file a claim for compensation. 7. How can I learn more?  Ask your doctor. He or she can give you the vaccine package insert or suggest other sources of information.  Call your local or state health department.  Contact the Centers for Disease Control and Prevention (CDC): ? Call (413)094-49341-331 396 0966 (1-800-CDC-INFO) or ? Visit CDC's website at PicCapture.uywww.cdc.gov/vaccines Vaccine Information Statement Tdap Vaccine (10/19/2013) This information is not intended to replace advice given to you by your health care provider. Make sure you discuss any questions you have with your health care provider. Document Released: 02/11/2012 Document Revised: 03/30/2018 Document Reviewed: 03/30/2018 Elsevier Interactive Patient Education  2019 [employer]

## 2018-10-15 ENCOUNTER — Encounter: Payer: Self-pay | Admitting: Nurse Practitioner

## 2018-11-17 ENCOUNTER — Other Ambulatory Visit: Payer: Self-pay | Admitting: Nurse Practitioner

## 2018-11-17 MED ORDER — LORATADINE 10 MG PO TABS: 10.0000 mg | ORAL_TABLET | Freq: Every day | ORAL | 1 refills | Status: DC

## 2018-11-17 NOTE — Telephone Encounter (Signed)
Pt mother call requesting refill on loratadine called into  [facility] Pt call back # is  [phone]

## 2019-01-13 ENCOUNTER — Ambulatory Visit: Payer: Medicaid Other | Admitting: Nurse Practitioner

## 2019-01-13 ENCOUNTER — Ambulatory Visit: Payer: Medicaid Other | Admitting: Family Medicine

## 2019-01-15 ENCOUNTER — Other Ambulatory Visit: Payer: Self-pay

## 2019-01-15 ENCOUNTER — Ambulatory Visit (INDEPENDENT_AMBULATORY_CARE_PROVIDER_SITE_OTHER): Payer: Medicaid Other | Admitting: Family Medicine

## 2019-01-15 ENCOUNTER — Encounter: Payer: Self-pay | Admitting: Family Medicine

## 2019-01-15 DIAGNOSIS — Q992 Fragile X chromosome: Secondary | ICD-10-CM

## 2019-01-15 DIAGNOSIS — F902 Attention-deficit hyperactivity disorder, combined type: Secondary | ICD-10-CM | POA: Diagnosis not present

## 2019-01-15 DIAGNOSIS — I10 Essential (primary) hypertension: Secondary | ICD-10-CM

## 2019-01-15 DIAGNOSIS — J302 Other seasonal allergic rhinitis: Secondary | ICD-10-CM | POA: Diagnosis not present

## 2019-01-15 MED ORDER — LISINOPRIL 20 MG PO TABS: 20.0000 mg | ORAL_TABLET | Freq: Every day | ORAL | 1 refills | Status: DC

## 2019-01-15 MED ORDER — METHYLPHENIDATE HCL ER 27 MG PO TB24: 27.0000 mg | ORAL_TABLET | Freq: Every day | ORAL | 0 refills | Status: DC

## 2019-01-15 MED ORDER — LORATADINE 10 MG PO TABS: 10.0000 mg | ORAL_TABLET | Freq: Every day | ORAL | 1 refills | Status: DC

## 2019-01-15 MED ORDER — METHYLPHENIDATE HCL ER (OSM) 27 MG PO TBCR: 27.0000 mg | EXTENDED_RELEASE_TABLET | Freq: Every day | ORAL | 0 refills | Status: DC

## 2019-01-15 NOTE — Progress Notes (Signed)
Virtual Visit via Telephone The purpose of this virtual visit is to provide medical care while limiting exposure to the novel coronavirus (COVID19) for both patient and office staff.  Consent was obtained for phone visit:  Yes.   Answered questions that patient had about telehealth interaction:  Yes.   I discussed the limitations, risks, security and privacy concerns of performing an evaluation and management service by telephone. I also discussed with the patient that there may be a patient responsible charge related to this service. The patient expressed understanding and agreed to proceed.  Patient Location: Home Provider Location: [facility] (Office)  PCP is [name], AGPCNP-BC - I am currently covering during her maternity leave.   ---------------------------------------------------------------------- Chief Complaint  Patient presents with  . ADHD    Fragile X syndrome     S: Reviewed CMA documentation. I have called patient and history provided by patient's aunt, Velna Hatchet, patient is present, gathered additional HPI as follows:  Fragile X ADHD No new concerns today. Doing well on current dose Concerta 27mg  CR daily. Has received 3 rx in past for up to 3 month supply at previous visits. Currently no issues with tolerating medication - Denies any new behavioral issues  CHRONIC HTN: Reports no new concerns. Limited home BP readings. Current Meds - Lisinopril 20mg  daily   Reports good compliance, took meds today. Tolerating well, w/o complaints. Denies CP, dyspnea, HA, edema, dizziness / lightheadedness  Seasonal Allergies Request refill on Loratadine 10mg  daily.   Past Medical History:  Diagnosis Date  . ADHD   . Allergy   . 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 Hypertension    Social History   Tobacco Use  . Smoking status: Never Smoker  . Smokeless tobacco: Never Used  Substance Use Topics  . Alcohol use: Never    Frequency: Never  . Drug use:  Never    Current Outpatient Medications:  .  lisinopril (ZESTRIL) 20 MG tablet, Take 1 tablet (20 mg total) by mouth daily., Disp: 90 tablet, Rfl: 1 .  loratadine (CLARITIN) 10 MG tablet, Take 1 tablet (10 mg total) by mouth daily., Disp: 90 tablet, Rfl: 1 .  [START ON 02/24/2019] methylphenidate 27 MG PO TB24, Take 1 tablet (27 mg total) by mouth daily., Disp: 30 tablet, Rfl: 0 .  [START ON 01/25/2019] methylphenidate 27 MG PO CR tablet, Take 1 tablet (27 mg total) by mouth daily., Disp: 30 tablet, Rfl: 0 .  [START ON 03/27/2019] methylphenidate 27 MG PO TB24, Take 1 tablet (27 mg total) by mouth daily., Disp: 30 tablet, Rfl: 0  Depression screen [facility] 2/9 04/06/2018  Decreased Interest 0  Down, Depressed, Hopeless 0  PHQ - 2 Score 0    No flowsheet data found.  -------------------------------------------------------------------------- O: No physical exam performed due to remote telephone encounter.   No results found for this or any previous visit (from the past 2160 hour(s)).  -------------------------------------------------------------------------- A&P:  Problem List Items Addressed This Visit    ADHD   Relevant Medications   methylphenidate 27 MG PO TB24 (Start on 03/27/2019)   methylphenidate 27 MG PO TB24 (Start on 02/24/2019)   methylphenidate 27 MG PO CR tablet (Start on 01/25/2019)   Hypertension Stable, controlled Refill Lisinopril 20mg  daily    Relevant Medications   lisinopril (ZESTRIL) 20 MG tablet    Other Visit Diagnoses    Seasonal allergies    -  Primary   Relevant Medications   loratadine (CLARITIN) 10 MG  tablet   Fragile X syndrome       Relevant Medications   methylphenidate 27 MG PO TB24 (Start on 03/27/2019)   methylphenidate 27 MG PO TB24 (Start on 02/24/2019)   methylphenidate 27 MG PO CR tablet (Start on 01/25/2019)      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  Rarely has behavioral outburts, but has hyperactivity and  decreased attention.  No recent neurological visit.  No significant complications of Fragile X currently.  Plan: 1. Continue Concerta at 27 mg once daily - refill x 3 separate rx authorized fill dates 6/1, 7/1, 8/1 2. Continue with daily activities, encouraged patient to become responsible for more chores around the home. 3. Consider future neurology appointment.     Meds ordered this encounter  Medications  . methylphenidate 27 MG PO TB24    Sig: Take 1 tablet (27 mg total) by mouth daily.    Dispense:  30 tablet    Refill:  0    First fill 03/27/19  . methylphenidate 27 MG PO TB24    Sig: Take 1 tablet (27 mg total) by mouth daily.    Dispense:  30 tablet    Refill:  0    First fill 02/24/19  . methylphenidate 27 MG PO CR tablet    Sig: Take 1 tablet (27 mg total) by mouth daily.    Dispense:  30 tablet    Refill:  0    First fill 01/25/19  . lisinopril (ZESTRIL) 20 MG tablet    Sig: Take 1 tablet (20 mg total) by mouth daily.    Dispense:  90 tablet    Refill:  1  . loratadine (CLARITIN) 10 MG tablet    Sig: Take 1 tablet (10 mg total) by mouth daily.    Dispense:  90 tablet    Refill:  1    Follow-up: - Return in 3 months for ADHD refills PCP  Patient verbalizes understanding with the above medical recommendations including the limitation of remote medical advice.  Specific follow-up and call-back criteria were given for patient to follow-up or seek medical care more urgently if needed.   - Time spent in direct consultation with patient on phone: 11 minutes  Saralyn PilarAlexander , DO [facility] 01/15/2019, 3:00 PM

## 2019-01-15 NOTE — Patient Instructions (Signed)
AVS info given by phone. No MyChart access 

## 2019-03-23 ENCOUNTER — Encounter: Payer: Medicaid Other | Admitting: Nurse Practitioner

## 2019-03-24 ENCOUNTER — Ambulatory Visit (INDEPENDENT_AMBULATORY_CARE_PROVIDER_SITE_OTHER): Payer: Medicaid Other | Admitting: Nurse Practitioner

## 2019-03-24 ENCOUNTER — Encounter: Payer: Self-pay | Admitting: Nurse Practitioner

## 2019-03-24 ENCOUNTER — Other Ambulatory Visit: Payer: Self-pay

## 2019-03-24 VITALS — BP 117/74 | HR 83 | Temp 97.6°F | Ht 69.0 in | Wt 161.4 lb

## 2019-03-24 DIAGNOSIS — Q992 Fragile X chromosome: Secondary | ICD-10-CM | POA: Diagnosis not present

## 2019-03-24 DIAGNOSIS — F902 Attention-deficit hyperactivity disorder, combined type: Secondary | ICD-10-CM

## 2019-03-24 DIAGNOSIS — Z0000 Encounter for general adult medical examination without abnormal findings: Secondary | ICD-10-CM

## 2019-03-24 MED ORDER — METHYLPHENIDATE HCL ER 27 MG PO TB24: 27.0000 mg | ORAL_TABLET | Freq: Every day | ORAL | 0 refills | Status: DC

## 2019-03-24 MED ORDER — METHYLPHENIDATE HCL ER (OSM) 27 MG PO TBCR: 27.0000 mg | EXTENDED_RELEASE_TABLET | Freq: Every day | ORAL | 0 refills | Status: DC

## 2019-03-24 NOTE — Progress Notes (Signed)
Subjective:    Patient ID: Ian Lam, male    DOB: [birth year], 33 y.o.   MRN: [id number]  Ian Lam is a 33 y.o. male presenting on 03/24/2019 for Annual Exam   HPI Annual Physical Exam Patient has been feeling well.  They have no acute concerns today. Sleeps 9 hours per night interrupted.  HEALTH MAINTENANCE: Weight/BMI: healthy and stable Physical activity: walking Diet: healthy Seatbelt: always Sunscreen: never HIV: due Dentistry: regular  VACCINES: Tetanus: up to date   ADHD/Fragile X: Concerta - continues doing well on lower dose of 27 mg once daily. Reports he feels better with lower dose.  Has no tremors, or heart racing.  Patient was last seen by Dr. Parks Ranger on 01/15/2019 for this and next medication refills are due on 8/1 from that visit.  Only two more months can be provided today for continued follow-up every 3 months in clinic.   Hypertension Likely drug induced, but improved with reduced dose of Concerta about 6 months ago.   Past Medical History:  Diagnosis Date  . ADHD   . Allergy   . 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 Hypertension    Past Surgical History:  Procedure Laterality Date  . NO PAST SURGERIES     Social History   Socioeconomic History  . Marital status: Single    Spouse name: Not on file  . Number of children: Not on file  . Years of education: 93  . Highest education level: Not on file  Occupational History  . Not on file  Social Needs  . Financial resource strain: Not on file  . Food insecurity    Worry: Not on file    Inability: Not on file  . Transportation needs    Medical: Not on file    Non-medical: Not on file  Tobacco Use  . Smoking status: Never Smoker  . Smokeless tobacco: Never Used  Substance and Sexual Activity  . Alcohol use: Never    Frequency: Never  . Drug use: Never  . Sexual activity: Not Currently  Lifestyle  . Physical activity    Days per week: Not on file    Minutes per session: Not  on file  . Stress: Not on file  Relationships  . Social Herbalist on phone: Not on file    Gets together: Not on file    Attends religious service: Not on file    Active member of club or organization: Not on file    Attends meetings of clubs or organizations: Not on file    Relationship status: Not on file  . Intimate partner violence    Fear of current or ex partner: Not on file    Emotionally abused: Not on file    Physically abused: Not on file    Forced sexual activity: Not on file  Other Topics Concern  . Not on file  Social History Narrative  . Not on file   Family History  Problem Relation Age of Onset  . Fragile X syndrome Brother    Current Outpatient Medications on File Prior to Visit  Medication Sig  . lisinopril (ZESTRIL) 20 MG tablet Take 1 tablet (20 mg total) by mouth daily.  Marland Kitchen loratadine (CLARITIN) 10 MG tablet Take 1 tablet (10 mg total) by mouth daily.  . methylphenidate 27 MG PO CR tablet Take 1 tablet (27 mg total) by mouth daily.  Derrill Memo ON 03/27/2019] methylphenidate 27 MG PO TB24  Take 1 tablet (27 mg total) by mouth daily.  . methylphenidate 27 MG PO TB24 Take 1 tablet (27 mg total) by mouth daily.   No current facility-administered medications on file prior to visit.     Review of Systems  Constitutional: Negative for activity change, appetite change, fatigue and unexpected weight change.  HENT: Negative for congestion, hearing loss and trouble swallowing.   Eyes: Negative for visual disturbance.  Respiratory: Negative for choking, shortness of breath and wheezing.   Cardiovascular: Negative for chest pain and palpitations.  Gastrointestinal: Negative for abdominal pain, blood in stool, constipation and diarrhea.  Genitourinary: Negative for difficulty urinating, discharge, flank pain, genital sores, penile pain, penile swelling, scrotal swelling and testicular pain.  Musculoskeletal: Negative for arthralgias, back pain and myalgias.   Skin: Negative for color change, rash and wound.  Allergic/Immunologic: Negative for environmental allergies.  Neurological: Negative for dizziness, seizures, weakness and headaches.  Psychiatric/Behavioral: Negative for behavioral problems, dysphoric mood, sleep disturbance and suicidal ideas. The patient is not nervous/anxious.    Per HPI unless specifically indicated above     Objective:    BP 117/74 (BP Location: Left Arm, Patient Position: Sitting, Cuff Size: Normal)   Pulse 83   Temp 97.6 F (36.4 C) (Oral)   Ht 5\' 9"  (1.753 m)   Wt 161 lb 6.4 oz (73.2 kg)   BMI 23.83 kg/m   Wt Readings from Last 3 Encounters:  03/24/19 161 lb 6.4 oz (73.2 kg)  10/12/18 159 lb 3.2 oz (72.2 kg)  07/06/18 155 lb (70.3 kg)    Physical Exam Vitals signs and nursing note reviewed.  Constitutional:      General: He is not in acute distress.    Appearance: He is well-developed.  HENT:     Head: Normocephalic and atraumatic.     Right Ear: External ear normal.     Left Ear: External ear normal.     Nose: Nose normal.  Eyes:     Conjunctiva/sclera: Conjunctivae normal.     Pupils: Pupils are equal, round, and reactive to light.  Neck:     Musculoskeletal: Normal range of motion and neck supple.     Thyroid: No thyromegaly.     Vascular: No JVD.     Trachea: No tracheal deviation.  Cardiovascular:     Rate and Rhythm: Normal rate and regular rhythm.     Heart sounds: Normal heart sounds. No murmur. No friction rub. No gallop.   Pulmonary:     Effort: Pulmonary effort is normal. No respiratory distress.     Breath sounds: Normal breath sounds.  Abdominal:     General: Bowel sounds are normal. There is no distension.     Palpations: Abdomen is soft.     Tenderness: There is no abdominal tenderness.  Musculoskeletal: Normal range of motion.  Lymphadenopathy:     Cervical: No cervical adenopathy.  Skin:    General: Skin is warm and dry.     Capillary Refill: Capillary refill takes  less than 2 seconds.  Neurological:     Mental Status: He is alert and oriented to person, place, and time. Mental status is at baseline.     Cranial Nerves: No cranial nerve deficit.  Psychiatric:        Attention and Perception: Attention normal.        Speech: He is communicative. Speech is delayed.        Behavior: Behavior normal. Behavior is cooperative.  Thought Content: Thought content normal.        Cognition and Memory: Cognition is impaired (mild).        Judgment: Judgment normal.    No results found for this or any previous visit.    Assessment & Plan:   Problem List Items Addressed This Visit      Other   ADHD   Relevant Medications   methylphenidate 27 MG PO CR tablet (Start on 04/27/2019)   methylphenidate 27 MG PO TB24 (Start on 05/27/2019)    Other Visit Diagnoses    Encounter for annual physical exam    -  Primary   Relevant Orders   TSH   Lipid panel   CBC with Differential/Platelet   COMPLETE METABOLIC PANEL WITH GFR   HIV Antibody (routine testing w rflx)   T4, free   Fragile X syndrome       Relevant Medications   methylphenidate 27 MG PO CR tablet (Start on 04/27/2019)   methylphenidate 27 MG PO TB24 (Start on 05/27/2019)      ADHD/Fragile X Stable without behavioral changes. No cognitive changes.  Stable on Concerta 27 mg once daily - remains high dose, but is tolerated at this time.  Likely contributor to hypertension, so should consider additional dose reduction in future. - Provided 2 months additional through 1 month supply Rx.  Has remaining Rx for fill on 8/1  - Continue working to stay active and participate in home chores - Verbally reviewed controlled substance contract today.  Previously signed. - Follow-up 3 months  Annual physical exam with no new findings.  Well adult with no acute concerns. 1. Obtain health maintenance screenings as above according to age. - Increase physical activity to 30 minutes most days of the week.  - Eat  healthy diet high in vegetables and fruits; low in refined carbohydrates. - Screening labs and tests as ordered 2. Return 1 year for annual physical.     Meds ordered this encounter  Medications  . methylphenidate 27 MG PO CR tablet    Sig: Take 1 tablet (27 mg total) by mouth daily.    Dispense:  30 tablet    Refill:  0    First fill 01/25/19    Order Specific Question:   Supervising Provider    Answer:   Smitty CordsKARAMALEGOS, ALEXANDER J [[id number]]  . methylphenidate 27 MG PO TB24    Sig: Take 1 tablet (27 mg total) by mouth daily.    Dispense:  30 tablet    Refill:  0    First fill 02/24/19    Order Specific Question:   Supervising Provider    Answer:   Smitty CordsKARAMALEGOS, ALEXANDER J [[id number]]     Follow up plan: Return in about 3 months (around 06/24/2019) for ADHD.  Wilhelmina McardleLauren Kynzley Dowson, DNP, AGPCNP-BC Adult Gerontology Primary Care Nurse Practitioner [facility] 03/24/2019, 8:47 AM

## 2019-03-24 NOTE — Patient Instructions (Addendum)
Ian Lam,   Thank you for coming in to clinic today.  1. No changes to medications.   Please schedule a follow-up appointment with Cassell Smiles, AGNP. Return in about 3 months (around 06/24/2019) for ADHD.  If you have any other questions or concerns, please feel free to call the clinic or send a message through [place]. You may also schedule an earlier appointment if necessary.  You will receive a survey after today's visit either digitally by e-mail or paper by [employer]. Your experiences and feedback matter to [place].  Please respond so we know how we are doing as we provide care for you.   Cassell Smiles, DNP, AGNP-BC Adult Gerontology Nurse Practitioner [place]

## 2019-03-25 LAB — LIPID PANEL
Cholesterol: 173 mg/dL (ref <200)
HDL: 41 mg/dL (ref >=40)
LDL Cholesterol (Calc): 118 mg/dL (calc) — ABNORMAL HIGH
Non-HDL Cholesterol (Calc): 132 mg/dL (calc) — ABNORMAL HIGH (ref <130)
Total CHOL/HDL Ratio: 4.2 (calc) (ref <5.0)
Triglycerides: 51 mg/dL (ref <150)

## 2019-03-25 LAB — COMPLETE METABOLIC PANEL WITH GFR
AG Ratio: 2.3 (calc) (ref 1.0–2.5)
ALT: 14 U/L (ref 9–46)
AST: 14 U/L (ref 10–40)
Albumin: 4.9 g/dL (ref 3.6–5.1)
Alkaline phosphatase (APISO): 52 U/L (ref 36–130)
BUN: 24 mg/dL (ref 7–25)
CO2: 27 mmol/L (ref 20–32)
Calcium: 9.7 mg/dL (ref 8.6–10.3)
Chloride: 104 mmol/L (ref 98–110)
Creat: 1.14 mg/dL (ref 0.60–1.35)
GFR, Est African American: 97 mL/min/{1.73_m2} (ref >=60)
GFR, Est Non African American: 84 mL/min/{1.73_m2} (ref >=60)
Globulin: 2.1 g/dL (calc) (ref 1.9–3.7)
Glucose, Bld: 99 mg/dL (ref 65–99)
Potassium: 4.1 mmol/L (ref 3.5–5.3)
Sodium: 140 mmol/L (ref 135–146)
Total Bilirubin: 0.6 mg/dL (ref 0.2–1.2)
Total Protein: 7 g/dL (ref 6.1–8.1)

## 2019-03-25 LAB — CBC WITH DIFFERENTIAL/PLATELET
Absolute Monocytes: 541 cells/uL (ref 200–950)
Basophils Absolute: 48 cells/uL (ref 0–200)
Basophils Relative: 0.9 %
Eosinophils Absolute: 80 cells/uL (ref 15–500)
Eosinophils Relative: 1.5 %
HCT: 44.7 % (ref 38.5–50.0)
Hemoglobin: 15.2 g/dL (ref 13.2–17.1)
Lymphs Abs: 2268 cells/uL (ref 850–3900)
MCH: 29.2 pg (ref 27.0–33.0)
MCHC: 34 g/dL (ref 32.0–36.0)
MCV: 86 fL (ref 80.0–100.0)
MPV: 10.3 fL (ref 7.5–12.5)
Monocytes Relative: 10.2 %
Neutro Abs: 2364 cells/uL (ref 1500–7800)
Neutrophils Relative %: 44.6 %
Platelets: 226 10*3/uL (ref 140–400)
RBC: 5.2 10*6/uL (ref 4.20–5.80)
RDW: 12.5 % (ref 11.0–15.0)
Total Lymphocyte: 42.8 %
WBC: 5.3 10*3/uL (ref 3.8–10.8)

## 2019-03-25 LAB — HIV ANTIBODY (ROUTINE TESTING W REFLEX): HIV 1&2 Ab, 4th Generation: NONREACTIVE

## 2019-03-25 LAB — TSH: TSH: 1.31 mIU/L (ref 0.40–4.50)

## 2019-03-25 LAB — T4, FREE: Free T4: 1.4 ng/dL (ref 0.8–1.8)

## 2019-06-14 ENCOUNTER — Ambulatory Visit (INDEPENDENT_AMBULATORY_CARE_PROVIDER_SITE_OTHER): Payer: Medicaid Other | Admitting: Nurse Practitioner

## 2019-06-14 ENCOUNTER — Encounter: Payer: Self-pay | Admitting: Nurse Practitioner

## 2019-06-14 ENCOUNTER — Other Ambulatory Visit: Payer: Self-pay

## 2019-06-14 ENCOUNTER — Ambulatory Visit: Payer: Medicaid Other | Admitting: Nurse Practitioner

## 2019-06-14 DIAGNOSIS — I10 Essential (primary) hypertension: Secondary | ICD-10-CM | POA: Diagnosis not present

## 2019-06-14 DIAGNOSIS — Q992 Fragile X chromosome: Secondary | ICD-10-CM | POA: Diagnosis not present

## 2019-06-14 DIAGNOSIS — F902 Attention-deficit hyperactivity disorder, combined type: Secondary | ICD-10-CM

## 2019-06-14 MED ORDER — METHYLPHENIDATE HCL ER (OSM) 27 MG PO TBCR: 27.0000 mg | EXTENDED_RELEASE_TABLET | Freq: Every day | ORAL | 0 refills | Status: DC

## 2019-06-14 MED ORDER — METHYLPHENIDATE HCL ER 27 MG PO TB24: 27.0000 mg | ORAL_TABLET | Freq: Every day | ORAL | 0 refills | Status: DC

## 2019-06-14 MED ORDER — LISINOPRIL 20 MG PO TABS: 20.0000 mg | ORAL_TABLET | Freq: Every day | ORAL | 1 refills | Status: DC

## 2019-06-14 NOTE — Progress Notes (Signed)
Subjective:    Patient ID: Ian Lam, male    DOB: [birth year], 33 y.o.   MRN: [id number]  Ian Lam is a 33 y.o. male presenting on 06/14/2019 for Medication Refill   HPI ADHD/Fragile X: Concerta - continues doing well on lower dose of 27 mg once daily. Reports he feels better with lower dose.  Has no tremors, or heart racing.    Hypertension Likely drug induced from Concerta. - Pt denies headache, lightheadedness, dizziness, changes in vision, chest tightness/pressure, palpitations, leg swelling, sudden loss of speech or loss of consciousness.    Past Medical History:  Diagnosis Date  . ADHD   . Allergy   . 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 Hypertension    Past Surgical History:  Procedure Laterality Date  . NO PAST SURGERIES     Social History   Socioeconomic History  . Marital status: Single    Spouse name: Not on file  . Number of children: Not on file  . Years of education: 46  . Highest education level: Not on file  Occupational History  . Not on file  Social Needs  . Financial resource strain: Not on file  . Food insecurity    Worry: Not on file    Inability: Not on file  . Transportation needs    Medical: Not on file    Non-medical: Not on file  Tobacco Use  . Smoking status: Never Smoker  . Smokeless tobacco: Never Used  Substance and Sexual Activity  . Alcohol use: Never    Frequency: Never  . Drug use: Never  . Sexual activity: Not Currently  Lifestyle  . Physical activity    Days per week: Not on file    Minutes per session: Not on file  . Stress: Not on file  Relationships  . Social Musician on phone: Not on file    Gets together: Not on file    Attends religious service: Not on file    Active member of club or organization: Not on file    Attends meetings of clubs or organizations: Not on file    Relationship status: Not on file  . Intimate partner violence    Fear of current or ex partner: Not on file   Emotionally abused: Not on file    Physically abused: Not on file    Forced sexual activity: Not on file  Other Topics Concern  . Not on file  Social History Narrative  . Not on file   Family History  Problem Relation Age of Onset  . Fragile X syndrome Brother    Current Outpatient Medications on File Prior to Visit  Medication Sig  . loratadine (CLARITIN) 10 MG tablet Take 1 tablet (10 mg total) by mouth daily.   No current facility-administered medications on file prior to visit.     Review of Systems  Constitutional: Negative for activity change, appetite change, fatigue and unexpected weight change.  HENT: Negative for congestion, hearing loss and trouble swallowing.   Eyes: Negative for visual disturbance.  Respiratory: Negative for choking, shortness of breath and wheezing.   Cardiovascular: Negative for chest pain and palpitations.  Gastrointestinal: Negative for abdominal pain, blood in stool, constipation and diarrhea.  Genitourinary: Negative for difficulty urinating, discharge, flank pain, genital sores, penile pain, penile swelling, scrotal swelling and testicular pain.  Musculoskeletal: Negative for arthralgias, back pain and myalgias.  Skin: Negative for color change, rash and wound.  Allergic/Immunologic:  Negative for environmental allergies.  Neurological: Negative for dizziness, seizures, weakness and headaches.  Psychiatric/Behavioral: Negative for behavioral problems, dysphoric mood, sleep disturbance and suicidal ideas. The patient is not nervous/anxious.    Per HPI unless specifically indicated above     Objective:    BP 113/71 (BP Location: Right Arm, Patient Position: Sitting)   Pulse 86   Ht 5\' 9"  (1.753 m)   Wt 158 lb 12.8 oz (72 kg)   BMI 23.45 kg/m   Wt Readings from Last 3 Encounters:  06/14/19 158 lb 12.8 oz (72 kg)  03/24/19 161 lb 6.4 oz (73.2 kg)  10/12/18 159 lb 3.2 oz (72.2 kg)    Physical Exam Vitals signs and nursing note reviewed.   Constitutional:      General: He is not in acute distress.    Appearance: He is well-developed.  HENT:     Head: Normocephalic and atraumatic.     Right Ear: External ear normal.     Left Ear: External ear normal.     Nose: Nose normal.  Eyes:     Conjunctiva/sclera: Conjunctivae normal.     Pupils: Pupils are equal, round, and reactive to light.  Neck:     Musculoskeletal: Normal range of motion and neck supple.     Thyroid: No thyromegaly.     Vascular: No JVD.     Trachea: No tracheal deviation.  Cardiovascular:     Rate and Rhythm: Normal rate and regular rhythm.     Heart sounds: Normal heart sounds. No murmur. No friction rub. No gallop.   Pulmonary:     Effort: Pulmonary effort is normal. No respiratory distress.     Breath sounds: Normal breath sounds.  Abdominal:     General: Bowel sounds are normal. There is no distension.     Palpations: Abdomen is soft.     Tenderness: There is no abdominal tenderness.  Musculoskeletal: Normal range of motion.  Lymphadenopathy:     Cervical: No cervical adenopathy.  Skin:    General: Skin is warm and dry.     Capillary Refill: Capillary refill takes less than 2 seconds.  Neurological:     Mental Status: He is alert and oriented to person, place, and time. Mental status is at baseline.     Cranial Nerves: No cranial nerve deficit.  Psychiatric:        Attention and Perception: Attention normal.        Speech: He is communicative. Speech is delayed.        Behavior: Behavior normal. Behavior is cooperative.        Thought Content: Thought content normal.        Cognition and Memory: Cognition is impaired (mild).        Judgment: Judgment normal.    Results for orders placed or performed in visit on 03/24/19  TSH  Result Value Ref Range   TSH 1.31 0.40 - 4.50 mIU/L  Lipid panel  Result Value Ref Range   Cholesterol 173 <200 mg/dL   HDL 41 > OR = 40 mg/dL   Triglycerides 51 <161<150 mg/dL   LDL Cholesterol (Calc) 118 (H)  mg/dL (calc)   Total CHOL/HDL Ratio 4.2 <5.0 (calc)   Non-HDL Cholesterol (Calc) 132 (H) <130 mg/dL (calc)  CBC with Differential/Platelet  Result Value Ref Range   WBC 5.3 3.8 - 10.8 Thousand/uL   RBC 5.20 4.20 - 5.80 Million/uL   Hemoglobin 15.2 13.2 - 17.1 g/dL   HCT 09.644.7 04.538.5 -  50.0 %   MCV 86.0 80.0 - 100.0 fL   MCH 29.2 27.0 - 33.0 pg   MCHC 34.0 32.0 - 36.0 g/dL   RDW 16.1 09.6 - 04.5 %   Platelets 226 140 - 400 Thousand/uL   MPV 10.3 7.5 - 12.5 fL   Neutro Abs 2,364 1,500 - 7,800 cells/uL   Lymphs Abs 2,268 850 - 3,900 cells/uL   Absolute Monocytes 541 200 - 950 cells/uL   Eosinophils Absolute 80 15 - 500 cells/uL   Basophils Absolute 48 0 - 200 cells/uL   Neutrophils Relative % 44.6 %   Total Lymphocyte 42.8 %   Monocytes Relative 10.2 %   Eosinophils Relative 1.5 %   Basophils Relative 0.9 %  COMPLETE METABOLIC PANEL WITH GFR  Result Value Ref Range   Glucose, Bld 99 65 - 99 mg/dL   BUN 24 7 - 25 mg/dL   Creat 4.09 8.11 - 9.14 mg/dL   GFR, Est Non African American 84 > OR = 60 mL/min/1.61m2   GFR, Est African American 97 > OR = 60 mL/min/1.26m2   BUN/Creatinine Ratio NOT APPLICABLE 6 - 22 (calc)   Sodium 140 135 - 146 mmol/L   Potassium 4.1 3.5 - 5.3 mmol/L   Chloride 104 98 - 110 mmol/L   CO2 27 20 - 32 mmol/L   Calcium 9.7 8.6 - 10.3 mg/dL   Total Protein 7.0 6.1 - 8.1 g/dL   Albumin 4.9 3.6 - 5.1 g/dL   Globulin 2.1 1.9 - 3.7 g/dL (calc)   AG Ratio 2.3 1.0 - 2.5 (calc)   Total Bilirubin 0.6 0.2 - 1.2 mg/dL   Alkaline phosphatase (APISO) 52 36 - 130 U/L   AST 14 10 - 40 U/L   ALT 14 9 - 46 U/L  HIV Antibody (routine testing w rflx)  Result Value Ref Range   HIV 1&2 Ab, 4th Generation NON-REACTIVE NON-REACTI  T4, free  Result Value Ref Range   Free T4 1.4 0.8 - 1.8 ng/dL      Assessment & Plan:   Problem List Items Addressed This Visit      Cardiovascular and Mediastinum   Hypertension   Relevant Medications   lisinopril (ZESTRIL) 20 MG tablet      Other   ADHD   Relevant Medications   methylphenidate 27 MG PO TB24 (Start on 08/25/2019)   methylphenidate 27 MG PO TB24 (Start on 07/26/2019)   methylphenidate 27 MG PO CR tablet (Start on 06/26/2019)    Other Visit Diagnoses    Fragile X syndrome       Relevant Medications   methylphenidate 27 MG PO TB24 (Start on 08/25/2019)   methylphenidate 27 MG PO TB24 (Start on 07/26/2019)   methylphenidate 27 MG PO CR tablet (Start on 06/26/2019)      ADHD/Fragile X Stable without behavioral changes. No cognitive changes.  Stable on Concerta 27 mg once daily - remains high dose, but is tolerated at this time.  Likely contributor to hypertension, so should consider additional dose reduction in future. - Provided 3 months  - Continue working to stay active and participate in home chores - Verbally reviewed controlled substance contract today.  Previously signed. - Follow-up 3 months     Meds ordered this encounter  Medications  . lisinopril (ZESTRIL) 20 MG tablet    Sig: Take 1 tablet (20 mg total) by mouth daily.    Dispense:  90 tablet    Refill:  1    Order  Specific Question:   Supervising Provider    Answer:   Olin Hauser [[id number]]  . methylphenidate 27 MG PO TB24    Sig: Take 1 tablet (27 mg total) by mouth daily.    Dispense:  30 tablet    Refill:  0    First fill 08/25/19    Order Specific Question:   Supervising Provider    Answer:   Olin Hauser [[id number]]  . methylphenidate 27 MG PO TB24    Sig: Take 1 tablet (27 mg total) by mouth daily.    Dispense:  30 tablet    Refill:  0    First fill 07/26/19    Order Specific Question:   Supervising Provider    Answer:   Olin Hauser [[id number]]  . methylphenidate 27 MG PO CR tablet    Sig: Take 1 tablet (27 mg total) by mouth daily.    Dispense:  30 tablet    Refill:  0    First fill 06/25/2019    Order Specific Question:   Supervising Provider    Answer:   Olin Hauser [[id number]]      Follow up plan: Return in about 3 months (around 09/14/2019) for ADHD, fragile X.  Cassell Smiles, DNP, AGPCNP-BC Adult Gerontology Primary Care Nurse Practitioner [facility] 06/14/2019, 9:11 PM

## 2019-06-14 NOTE — Patient Instructions (Signed)
Ian Lam,   Thank you for coming in to clinic today.  1. Continue current medications. - Heart Rate is normal - Blood pressure is controlled.     Please schedule a follow-up appointment. No follow-ups on file.  If you have any other questions or concerns, please feel free to call the clinic or send a message through [facility]. You may also schedule an earlier appointment if necessary.  You will receive a survey after today's visit either digitally by e-mail or paper by [employer]. Your experiences and feedback matter to [place].  Please respond so we know how we are doing as we provide care for you.  Cassell Smiles, DNP, AGNP-BC Adult Gerontology Nurse Practitioner [facility]

## 2019-06-24 ENCOUNTER — Ambulatory Visit: Payer: Medicaid Other | Admitting: Nurse Practitioner

## 2019-07-14 ENCOUNTER — Encounter: Payer: Self-pay | Admitting: Family Medicine

## 2019-09-24 ENCOUNTER — Other Ambulatory Visit: Payer: Self-pay | Admitting: Nurse Practitioner

## 2019-09-24 DIAGNOSIS — Q992 Fragile X chromosome: Secondary | ICD-10-CM

## 2019-09-24 DIAGNOSIS — F902 Attention-deficit hyperactivity disorder, combined type: Secondary | ICD-10-CM

## 2019-10-20 ENCOUNTER — Other Ambulatory Visit: Payer: Self-pay

## 2019-10-20 DIAGNOSIS — F902 Attention-deficit hyperactivity disorder, combined type: Secondary | ICD-10-CM

## 2019-10-20 DIAGNOSIS — Q992 Fragile X chromosome: Secondary | ICD-10-CM

## 2019-10-20 MED ORDER — METHYLPHENIDATE HCL ER (OSM) 27 MG PO TBCR: 27.0000 mg | EXTENDED_RELEASE_TABLET | Freq: Every day | ORAL | 0 refills | Status: DC

## 2019-10-20 NOTE — Telephone Encounter (Signed)
Please refill patient medications. Appt had to be reschedule due conflict on our part.

## 2019-10-25 ENCOUNTER — Ambulatory Visit: Payer: Medicaid Other | Admitting: Family Medicine

## 2019-11-13 ENCOUNTER — Other Ambulatory Visit: Payer: Self-pay | Admitting: Family Medicine

## 2019-11-13 DIAGNOSIS — J302 Other seasonal allergic rhinitis: Secondary | ICD-10-CM

## 2019-11-17 ENCOUNTER — Other Ambulatory Visit: Payer: Self-pay

## 2019-11-17 ENCOUNTER — Ambulatory Visit (INDEPENDENT_AMBULATORY_CARE_PROVIDER_SITE_OTHER): Payer: Medicaid Other | Admitting: Family Medicine

## 2019-11-17 ENCOUNTER — Encounter: Payer: Self-pay | Admitting: Family Medicine

## 2019-11-17 VITALS — BP 117/63 | HR 87 | Temp 97.3°F | Ht 69.0 in | Wt 160.8 lb

## 2019-11-17 DIAGNOSIS — E7800 Pure hypercholesterolemia, unspecified: Secondary | ICD-10-CM

## 2019-11-17 DIAGNOSIS — I10 Essential (primary) hypertension: Secondary | ICD-10-CM | POA: Diagnosis not present

## 2019-11-17 DIAGNOSIS — F902 Attention-deficit hyperactivity disorder, combined type: Secondary | ICD-10-CM | POA: Diagnosis not present

## 2019-11-17 DIAGNOSIS — Q992 Fragile X chromosome: Secondary | ICD-10-CM | POA: Diagnosis not present

## 2019-11-17 LAB — POCT URINALYSIS DIPSTICK
Blood, UA: NEGATIVE
Glucose, UA: NEGATIVE
Ketones, UA: NEGATIVE
Leukocytes, UA: NEGATIVE
Nitrite, UA: NEGATIVE
Protein, UA: NEGATIVE
Spec Grav, UA: 1.01 (ref 1.010–1.025)
Urobilinogen, UA: 0.2 E.U./dL
pH, UA: 8.5 — AB (ref 5.0–8.0)

## 2019-11-17 LAB — POCT UA - MICROALBUMIN: Microalbumin Ur, POC: 20 mg/L

## 2019-11-17 MED ORDER — METHYLPHENIDATE HCL ER (OSM) 27 MG PO TBCR: 27.0000 mg | EXTENDED_RELEASE_TABLET | Freq: Every day | ORAL | 0 refills | Status: DC

## 2019-11-17 MED ORDER — LISINOPRIL 20 MG PO TABS: 20.0000 mg | ORAL_TABLET | Freq: Every day | ORAL | 1 refills | Status: DC

## 2019-11-17 NOTE — Progress Notes (Signed)
Subjective:    Patient ID: Ian Lam, male    DOB: Jan 24, 1986, 34 y.o.   MRN: [id number]  Ian Lam is a 34 y.o. male presenting on 11/17/2019 for ADHD   HPI  Ian Lam has his aunt here today in clinic as his chaperone  ADHD: Compliant with meds:  Pt is taking Concerta 27mg  daily and reports taking the medication as prescribed and denies side effects. Benefit from med (ie increase in concentration): Yes Sleeping well nightly Pt denies CP, rapid HR, dry mouth, weight loss, difficulty sleeping, agitation/mood disturbances. Controlled Substance Database is checked and no suspicious findings.  Hypertension - He is not checking BP at home or outside of clinic.    - Current medications: Lisinopril 20mg  daily, tolerating well without side effects - He is not currently symptomatic. - Pt denies headache, lightheadedness, dizziness, changes in vision, chest tightness/pressure, palpitations, leg swelling, sudden loss of speech or loss of consciousness. - He  reports no regular exercise routine. - His diet is moderate in salt, moderate in fat, and moderate in carbohydrates.   Depression screen [facility] 2/9 11/17/2019 04/06/2018  Decreased Interest 0 0  Down, Depressed, Hopeless 0 0  PHQ - 2 Score 0 0    Social History   Tobacco Use  . Smoking status: Never Smoker  . Smokeless tobacco: Never Used  Substance Use Topics  . Alcohol use: Never  . Drug use: Never    Review of Systems  Constitutional: Negative.   HENT: Negative.   Eyes: Negative.   Respiratory: Negative.   Cardiovascular: Negative.   Gastrointestinal: Negative.   Endocrine: Negative.   Genitourinary: Negative.   Musculoskeletal: Negative.   Skin: Negative.   Allergic/Immunologic: Negative.   Neurological: Negative.   Hematological: Negative.   Psychiatric/Behavioral: Negative.    Per HPI unless specifically indicated above     Objective:    BP 117/63 (BP Location: Left Arm, Patient Position:  Sitting, Cuff Size: Normal)   Pulse 87   Temp (!) 97.3 F (36.3 C) (Temporal)   Ht 5\' 9"  (1.753 m)   Wt 160 lb 12.8 oz (72.9 kg)   BMI 23.75 kg/m   Wt Readings from Last 3 Encounters:  11/17/19 160 lb 12.8 oz (72.9 kg)  06/14/19 158 lb 12.8 oz (72 kg)  03/24/19 161 lb 6.4 oz (73.2 kg)    Physical Exam Vitals reviewed. Exam conducted with a chaperone present.  Constitutional:      General: He is not in acute distress.    Appearance: Normal appearance. He is well-groomed and normal weight. He is not ill-appearing or toxic-appearing.  HENT:     Head: Normocephalic.  Eyes:     General: Lids are normal. Vision grossly intact. No scleral icterus.       Right eye: No discharge or hordeolum.        Left eye: No discharge or hordeolum.     Extraocular Movements: Extraocular movements intact.     Conjunctiva/sclera: Conjunctivae normal.     Pupils: Pupils are equal, round, and reactive to light.  Neck:     Thyroid: No thyroid mass, thyromegaly or thyroid tenderness.  Cardiovascular:     Rate and Rhythm: Normal rate and regular rhythm.     Pulses: Normal pulses.          Dorsalis pedis pulses are 2+ on the right side and 2+ on the left side.       Posterior tibial pulses are 2+ on the right side and  2+ on the left side.     Heart sounds: Normal heart sounds. No murmur. No friction rub. No gallop.   Pulmonary:     Effort: Pulmonary effort is normal. No respiratory distress.     Breath sounds: Normal breath sounds.  Abdominal:     General: Abdomen is flat. Bowel sounds are normal. There is no distension or abdominal bruit.     Palpations: Abdomen is soft. There is no hepatomegaly, splenomegaly or mass.     Tenderness: There is no abdominal tenderness. There is no guarding or rebound.  Musculoskeletal:        General: Normal range of motion.     Right lower leg: No edema.     Left lower leg: No edema.  Feet:     Right foot:     Skin integrity: Skin integrity normal.     Left foot:       Skin integrity: Skin integrity normal.  Skin:    General: Skin is warm and dry.     Capillary Refill: Capillary refill takes less than 2 seconds.  Neurological:     General: No focal deficit present.     Mental Status: He is alert and oriented to person, place, and time.     Cranial Nerves: Cranial nerves are intact. No cranial nerve deficit.     Sensory: Sensation is intact. No sensory deficit.     Motor: Motor function is intact. No weakness.     Coordination: Coordination is intact. Coordination normal.     Gait: Gait is intact. Gait normal.     Deep Tendon Reflexes: Reflexes normal.  Psychiatric:        Attention and Perception: Attention and perception normal.        Mood and Affect: Mood and affect normal.        Speech: Speech normal.        Behavior: Behavior normal. Behavior is cooperative.        Thought Content: Thought content normal.        Cognition and Memory: Cognition and memory normal.     Results for orders placed or performed in visit on 03/24/19  TSH  Result Value Ref Range   TSH 1.31 0.40 - 4.50 mIU/L  Lipid panel  Result Value Ref Range   Cholesterol 173 <200 mg/dL   HDL 41 > OR = 40 mg/dL   Triglycerides 51 <151 mg/dL   LDL Cholesterol (Calc) 118 (H) mg/dL (calc)   Total CHOL/HDL Ratio 4.2 <5.0 (calc)   Non-HDL Cholesterol (Calc) 132 (H) <130 mg/dL (calc)  CBC with Differential/Platelet  Result Value Ref Range   WBC 5.3 3.8 - 10.8 Thousand/uL   RBC 5.20 4.20 - 5.80 Million/uL   Hemoglobin 15.2 13.2 - 17.1 g/dL   HCT 76.1 60.7 - 37.1 %   MCV 86.0 80.0 - 100.0 fL   MCH 29.2 27.0 - 33.0 pg   MCHC 34.0 32.0 - 36.0 g/dL   RDW 06.2 69.4 - 85.4 %   Platelets 226 140 - 400 Thousand/uL   MPV 10.3 7.5 - 12.5 fL   Neutro Abs 2,364 1,500 - 7,800 cells/uL   Lymphs Abs 2,268 850 - 3,900 cells/uL   Absolute Monocytes 541 200 - 950 cells/uL   Eosinophils Absolute 80 15 - 500 cells/uL   Basophils Absolute 48 0 - 200 cells/uL   Neutrophils Relative %  44.6 %   Total Lymphocyte 42.8 %   Monocytes Relative 10.2 %   Eosinophils  Relative 1.5 %   Basophils Relative 0.9 %  COMPLETE METABOLIC PANEL WITH GFR  Result Value Ref Range   Glucose, Bld 99 65 - 99 mg/dL   BUN 24 7 - 25 mg/dL   Creat 1.14 0.60 - 1.35 mg/dL   GFR, Est Non African American 84 > OR = 60 mL/min/1.52m2   GFR, Est African American 97 > OR = 60 mL/min/1.80m2   BUN/Creatinine Ratio NOT APPLICABLE 6 - 22 (calc)   Sodium 140 135 - 146 mmol/L   Potassium 4.1 3.5 - 5.3 mmol/L   Chloride 104 98 - 110 mmol/L   CO2 27 20 - 32 mmol/L   Calcium 9.7 8.6 - 10.3 mg/dL   Total Protein 7.0 6.1 - 8.1 g/dL   Albumin 4.9 3.6 - 5.1 g/dL   Globulin 2.1 1.9 - 3.7 g/dL (calc)   AG Ratio 2.3 1.0 - 2.5 (calc)   Total Bilirubin 0.6 0.2 - 1.2 mg/dL   Alkaline phosphatase (APISO) 52 36 - 130 U/L   AST 14 10 - 40 U/L   ALT 14 9 - 46 U/L  HIV Antibody (routine testing w rflx)  Result Value Ref Range   HIV 1&2 Ab, 4th Generation NON-REACTIVE NON-REACTI  T4, free  Result Value Ref Range   Free T4 1.4 0.8 - 1.8 ng/dL      Assessment & Plan:   Problem List Items Addressed This Visit      Cardiovascular and Mediastinum   Hypertension - Primary    Stable and well controlled hypertension.  BP goal < 130/80.  Pt is not working on lifestyle modifications.  Taking medications tolerating well without side effects. No complications.  Plan: 1. Continue taking Lisinopril 20mg  daily 2. Obtain labs ordered today  3. Encouraged heart healthy diet and increasing exercise to 30 minutes most days of the week, going no more than 2 days in a row without exercise. 4. Check BP 1-2 x per week at home, keep log, and bring to clinic at next appointment. 5. Follow up 6 months.         Relevant Medications   lisinopril (ZESTRIL) 20 MG tablet   Other Relevant Orders   CBC with Differential   COMPLETE METABOLIC PANEL WITH GFR   POCT Urinalysis Dipstick   POCT UA - Microalbumin     Other   ADHD     Currently stable on Concerta 27mg  daily.   Plan: 1) Have labs completed that were ordered today 2) Continue Concerta 27mg  once daily.  Reviewed signed controlled substances contract on file verbally. 3) Continue to participate in household chores 4) Follow up in 6 months.  If any worsening of symptoms will discuss need for referral to neurology/psychiatry.      Relevant Medications   methylphenidate (CONCERTA) 27 MG PO CR tablet    Other Visit Diagnoses    Elevated LDL cholesterol level       Relevant Orders   Lipid Profile   Fragile X syndrome       Relevant Medications   methylphenidate (CONCERTA) 27 MG PO CR tablet      Meds ordered this encounter  Medications  . lisinopril (ZESTRIL) 20 MG tablet    Sig: Take 1 tablet (20 mg total) by mouth daily.    Dispense:  90 tablet    Refill:  1  . methylphenidate (CONCERTA) 27 MG PO CR tablet    Sig: Take 1 tablet (27 mg total) by mouth daily.    Dispense:  30 tablet    Refill:  0      Follow up plan: Return in about 6 months (around 05/19/2020) for ADHD & HTN F/U.   Charlaine Dalton, FNP Family Nurse Practitioner [facility] 11/17/2019, 8:45 AM

## 2019-11-17 NOTE — Patient Instructions (Signed)
Continue your medications as directed.  As we discussed, have your labs drawn today and we will contact you with the results.  Try to get exercise a minimum of 30 minutes per day at least 5 days per week as well as  adequate water intake all while measuring blood pressure a few times per week.  Keep a blood pressure log and bring back to clinic at your next visit.  If your readings are consistently over 140/90 to contact our office/send me a MyChart message and we will see you sooner.  Can try DASH and Mediterranean diet options, avoiding processed foods, lowering sodium intake, avoiding pork products, and eating a plant based diet for optimal health.  We will plan to see you back in 6 months for ADHD and hypertension follow up.  You will receive a survey after today's visit either digitally by e-mail or paper by [employer]. Your experiences and feedback matter to [place].  Please respond so we know how we are doing as we provide care for you.  Call us with any questions/concerns/needs.  It is my goal to be available to you for your health concerns.  Thanks for choosing me to be a partner in your healthcare needs!  Charlaine Dalton, FNP-C Family Nurse Practitioner [facility] Phone: [phone]

## 2019-11-17 NOTE — Assessment & Plan Note (Signed)
Currently stable on Concerta 27mg daily.  Plan: 1. Have labs completed that were ordered today 2. Continue Concerta 27mg once daily.  Reviewed signed controlled substances contract on file verbally. 3. Continue to participate in household chores. 4. Follow up in 6 months.  If any worsening of symptoms will discuss need for referral to neurology/psychiatry. 

## 2019-11-17 NOTE — Assessment & Plan Note (Signed)
Stable and well controlled hypertension.  BP goal < 130/80.  Pt is not working on lifestyle modifications.  Taking medications tolerating well without side effects. No complications.  Plan: 1. Continue taking Lisinopril 20mg  daily 2. Obtain labs ordered today  3. Encouraged heart healthy diet and increasing exercise to 30 minutes most days of the week, going no more than 2 days in a row without exercise. 4. Check BP 1-2 x per week at home, keep log, and bring to clinic at next appointment. 5. Follow up 6 months.

## 2019-12-22 ENCOUNTER — Other Ambulatory Visit: Payer: Self-pay | Admitting: Family Medicine

## 2019-12-22 DIAGNOSIS — F902 Attention-deficit hyperactivity disorder, combined type: Secondary | ICD-10-CM

## 2019-12-22 DIAGNOSIS — Q992 Fragile X chromosome: Secondary | ICD-10-CM

## 2019-12-22 NOTE — Telephone Encounter (Signed)
Requested medication (s) are due for refill today: yes  Requested medication (s) are on the active medication list: yes  Last refill: 11/20/19  Future visit scheduled: yes  Notes to clinic:  not delegated   Requested Prescriptions  Pending Prescriptions Disp Refills   CONCERTA 27 MG CR tablet [Pharmacy Med Name: Concerta 27 mg tablet,extended release] 30 tablet 0    Sig: TAKE ONE TABLET BY MOUTH ONCE DAILY      Not Delegated - Psychiatry:  Stimulants/ADHD Failed - 12/22/2019 12:51 PM      Failed - This refill cannot be delegated      Failed - Urine Drug Screen completed in last 360 days.      Passed - Valid encounter within last 3 months    Recent Outpatient Visits           1 month ago Essential hypertension   [facility], Jodelle Gross, FNP   6 months ago Essential hypertension   [facility] Kyung Rudd, Alison Stalling, NP   9 months ago Encounter for annual physical exam   [facility] Ky[facility] Rudd, Alison Stalling, NP   11 months ago Fragile X syndrome   [facility] Smitty Cords, DO   1 year ago Need for diphtheria-tetanus-pertussis (Tdap) vaccine   [facility] Kyung Rudd, Alison Stalling, NP       Future Appointments             In 4 months [place], Jodelle Gross, FNP [facility], [facility]

## 2019-12-22 NOTE — Telephone Encounter (Signed)
[place]ally [place], 12/22/19.  According to PMP aware, pt last received a refill on 11/20/2019.  Refill of his controlled substance will be provided today.  

## 2020-01-22 ENCOUNTER — Other Ambulatory Visit: Payer: Self-pay | Admitting: Family Medicine

## 2020-01-22 DIAGNOSIS — Q992 Fragile X chromosome: Secondary | ICD-10-CM

## 2020-01-22 DIAGNOSIS — F902 Attention-deficit hyperactivity disorder, combined type: Secondary | ICD-10-CM

## 2020-01-22 NOTE — Telephone Encounter (Signed)
Requested medication (s) are due for refill today: yes  Requested medication (s) are on the active medication list: yes  Last refill:  12/22/19  Future visit scheduled: yes  Notes to clinic:  med not delegated to NT to RF   Requested Prescriptions  Pending Prescriptions Disp Refills   CONCERTA 27 MG CR tablet [Pharmacy Med Name: Concerta 27 mg tablet,extended release] 30 tablet 0    Sig: TAKE ONE TABLET BY MOUTH ONCE DAILY      Not Delegated - Psychiatry:  Stimulants/ADHD Failed - 01/22/2020 11:30 AM      Failed - This refill cannot be delegated      Failed - Urine Drug Screen completed in last 360 days.      Passed - Valid encounter within last 3 months    Recent Outpatient Visits           2 months ago Essential hypertension   [facility], Jodelle Gross, FNP   7 months ago Essential hypertension   [facility] Kyung Rudd, Alison Stalling, NP   10 months ago Encounter for annual physical exam   [facility] Kyung Rudd, Alison Stalling, NP   1 year ago Fragile X syndrome   [facility] Smitty Cords, DO   1 year ago Need for diphtheria-tetanus-pertussis (Tdap) vaccine   [facility] Kyung Rudd, Alison Stalling, NP       Future Appointments             In 3 months Malfi, Jodelle Gross, FNP [facility], [facility]

## 2020-01-25 NOTE — Telephone Encounter (Signed)
Personally reviewed [place] today, 01/25/20.  According to PMP aware, pt last received a refill on 12/22/2019.  Refill of his controlled substance will be provided today.

## 2020-02-24 ENCOUNTER — Other Ambulatory Visit: Payer: Self-pay | Admitting: Family Medicine

## 2020-02-24 DIAGNOSIS — F902 Attention-deficit hyperactivity disorder, combined type: Secondary | ICD-10-CM

## 2020-02-24 DIAGNOSIS — Q992 Fragile X chromosome: Secondary | ICD-10-CM

## 2020-02-24 MED ORDER — METHYLPHENIDATE HCL ER (OSM) 27 MG PO TBCR: 27.0000 mg | EXTENDED_RELEASE_TABLET | Freq: Every day | ORAL | 0 refills | Status: DC

## 2020-02-24 NOTE — Telephone Encounter (Signed)
Requested medication (s) are due for refill today:   Requested medication (s) are on the active medication list: yes    Future visit scheduled: yes  Notes to clinic: this refill cannot be delegated    Requested Prescriptions  Pending Prescriptions Disp Refills   methylphenidate (CONCERTA) 27 MG PO CR tablet 30 tablet 0    Sig: Take 1 tablet (27 mg total) by mouth daily.      Not Delegated - Psychiatry:  Stimulants/ADHD Failed - 02/24/2020 10:25 AM      Failed - This refill cannot be delegated      Failed - Urine Drug Screen completed in last 360 days.      Failed - Valid encounter within last 3 months    Recent Outpatient Visits           3 months ago Essential hypertension   [facility], Jodelle Gross, FNP   8 months ago Essential hypertension   [facility] Kyung Rudd, Alison Stalling, NP   11 months ago Encounter for annual physical exam   [facility] Kyung Rudd, Alison Stalling, NP   1 year ago Fragile X syndrome   [facility] Smitty Cords, DO   1 year ago Need for diphtheria-tetanus-pertussis (Tdap) vaccine   [facility] Kyung Rudd, Alison Stalling, NP       Future Appointments             In 2 months Malfi, Jodelle Gross, FNP [facility], [facility]

## 2020-02-24 NOTE — Telephone Encounter (Signed)
Personally reviewed [place] today, 02/24/20.  According to PMP aware, pt last received a refill on 01/25/2020.  Refill of his controlled substance will be provided today.  

## 2020-02-24 NOTE — Telephone Encounter (Signed)
Medication Refill - Medication: CONCERTA 27 MG CR tablet    Preferred Pharmacy (with phone number or street name):  [facility] - [place], [place] - [address] Phone:  [phone]  Fax:  [phone]       Agent: Please be advised that RX refills may take up to 3 business days. We ask that you follow-up with your pharmacy.

## 2020-03-23 ENCOUNTER — Telehealth: Payer: Self-pay | Admitting: Family Medicine

## 2020-03-23 ENCOUNTER — Other Ambulatory Visit: Payer: Self-pay | Admitting: Family Medicine

## 2020-03-23 DIAGNOSIS — Q992 Fragile X chromosome: Secondary | ICD-10-CM

## 2020-03-23 DIAGNOSIS — F902 Attention-deficit hyperactivity disorder, combined type: Secondary | ICD-10-CM

## 2020-03-23 NOTE — Telephone Encounter (Signed)
Requested medication (s) are due for refill today:  Provider to review  Requested medication (s) are on the active medication list:   Yes  Future visit scheduled:   Yes   Last ordered: 4 wks ago  Clinic note:   Returned because this is a non delegated refill   Requested Prescriptions  Pending Prescriptions Disp Refills   CONCERTA 27 MG CR tablet [Pharmacy Med Name: Concerta 27 mg tablet,extended release] 30 tablet 0    Sig: TAKE ONE TABLET BY MOUTH ONCE DAILY      Not Delegated - Psychiatry:  Stimulants/ADHD Failed - 03/23/2020 10:23 AM      Failed - This refill cannot be delegated      Failed - Urine Drug Screen completed in last 360 days.      Failed - Valid encounter within last 3 months    Recent Outpatient Visits           4 months ago Essential hypertension   [facility], Jodelle Gross, FNP   9 months ago Essential hypertension   [facility] Kyung Rudd, Alison Stalling, NP   1 year ago Encounter for annual physical exam   [facility] Kyung Rudd, Alison Stalling, NP   1 year ago Fragile X syndrome   [facility] Smitty Cords, DO   1 year ago Need for diphtheria-tetanus-pertussis (Tdap) vaccine   [facility] Kyung Rudd, Alison Stalling, NP       Future Appointments             In 1 month Malfi, Jodelle Gross, FNP [facility], [facility]

## 2020-03-23 NOTE — Telephone Encounter (Signed)
Personally reviewed [place]CC[place] today, 03/23/20.  According to PMP aware, pt last received a refill on 02/25/2020.  Refill of his controlled substance will be provided today.

## 2020-03-23 NOTE — Telephone Encounter (Signed)
Refill request was already sent to PCP at 10:32 AM, today.

## 2020-03-23 NOTE — Telephone Encounter (Signed)
Medication Refill - Medication: concerta  Has the patient contacted their pharmacy? Yes.   (Agent: If no, request that the patient contact the pharmacy for the refill.) (Agent: If yes, when and what did the pharmacy advise?)  Preferred Pharmacy (with phone number or street name):  [facility] - [place], [place] - [address]  [address] [place] [address]  Phone: [phone] Fax: [phone]  Hours: Not open 24 hours     Agent: Please be advised that RX refills may take up to 3 business days. We ask that you follow-up with your pharmacy.

## 2020-04-03 ENCOUNTER — Other Ambulatory Visit: Payer: Self-pay | Admitting: Family Medicine

## 2020-04-03 DIAGNOSIS — J302 Other seasonal allergic rhinitis: Secondary | ICD-10-CM

## 2020-04-24 ENCOUNTER — Other Ambulatory Visit: Payer: Self-pay | Admitting: Family Medicine

## 2020-04-24 ENCOUNTER — Telehealth: Payer: Self-pay | Admitting: Family Medicine

## 2020-04-24 DIAGNOSIS — F902 Attention-deficit hyperactivity disorder, combined type: Secondary | ICD-10-CM

## 2020-04-24 DIAGNOSIS — Q992 Fragile X chromosome: Secondary | ICD-10-CM

## 2020-04-24 NOTE — Telephone Encounter (Signed)
Personally reviewed [place]CC[place] today, 04/24/20.  According to PMP aware, pt last received a refill on 03/24/2020.  Refill of his controlled substance will be provided today.

## 2020-04-24 NOTE — Telephone Encounter (Signed)
Requested medication (s) are due for refill today - yes  Requested medication (s) are on the active medication list -yes  Future visit scheduled -yes  Last refill: 03/23/20  Notes to clinic: Request for non delegated Rx  Requested Prescriptions  Pending Prescriptions Disp Refills   CONCERTA 27 MG CR tablet [Pharmacy Med Name: Concerta 27 mg tablet,extended release] 30 tablet 0    Sig: TAKE ONE TABLET BY MOUTH ONCE DAILY      Not Delegated - Psychiatry:  Stimulants/ADHD Failed - 04/24/2020  9:46 AM      Failed - This refill cannot be delegated      Failed - Urine Drug Screen completed in last 360 days.      Failed - Valid encounter within last 3 months    Recent Outpatient Visits           5 months ago Essential hypertension   [facility], Jodelle Gross, FNP   10 months ago Essential hypertension   [place] Kyung Rudd, Alison Stalling, NP   1 year ago Encounter for annual physical exam   [facility] Kyung Rudd, Alison Stalling, NP   1 year ago Fragile X syndrome   [facility] Smitty Cords, DO   1 year ago Need for diphtheria-tetanus-pertussis (Tdap) vaccine   [facility] Kyung Rudd, Alison Stalling, NP       Future Appointments             In 3 weeks Malfi, Jodelle Gross, FNP [facility], [facility]                Requested Prescriptions  Pending Prescriptions Disp Refills   CONCERTA 27 MG CR tablet [Pharmacy Med Name: Concerta 27 mg tablet,extended release] 30 tablet 0    Sig: TAKE ONE TABLET BY MOUTH ONCE DAILY      Not Delegated - Psychiatry:  Stimulants/ADHD Failed - 04/24/2020  9:46 AM      Failed - This refill cannot be delegated      Failed - Urine Drug Screen completed in last 360 days.      Failed - Valid encounter within last 3 months    Recent Outpatient Visits           5 months ago Essential hypertension   [facility], Jodelle Gross, FNP   10  months ago Essential hypertension   [facility] Kyung Rudd, Alison Stalling, NP   1 year ago Encounter for annual physical exam   [facility] Kyung Rudd, Alison Stalling, NP   1 year ago Fragile X syndrome   [facility] Smitty Cords, DO   1 year ago Need for diphtheria-tetanus-pertussis (Tdap) vaccine   [facility] Kyung Rudd, Alison Stalling, NP       Future Appointments             In 3 weeks Malfi, Jodelle Gross, FNP [facility], [facility]

## 2020-04-24 NOTE — Telephone Encounter (Signed)
Medication Refill - Medication: Concerta 27 mg  Has the patient contacted their pharmacy? No. (Agent: If no, request that the patient contact the pharmacy for the refill.) (Agent: If yes, when and what did the pharmacy advise?)  Preferred Pharmacy (with phone number or street name): [facility]  Agent: Please be advised that RX refills may take up to 3 business days. We ask that you follow-up with your pharmacy.

## 2020-05-19 ENCOUNTER — Ambulatory Visit: Payer: Medicaid Other | Admitting: Family Medicine

## 2020-05-24 ENCOUNTER — Other Ambulatory Visit: Payer: Self-pay | Admitting: Family Medicine

## 2020-05-24 DIAGNOSIS — F902 Attention-deficit hyperactivity disorder, combined type: Secondary | ICD-10-CM

## 2020-05-24 DIAGNOSIS — Q992 Fragile X chromosome: Secondary | ICD-10-CM

## 2020-05-24 MED ORDER — METHYLPHENIDATE HCL ER (OSM) 27 MG PO TBCR: 27.0000 mg | EXTENDED_RELEASE_TABLET | Freq: Every day | ORAL | 0 refills | Status: DC

## 2020-05-24 NOTE — Telephone Encounter (Signed)
Personally reviewed [place]CC[place] today, 05/24/20.  According to PMP aware, pt last received a refill on 04/24/2020.  Refill of his controlled substance will be provided today.  

## 2020-05-24 NOTE — Telephone Encounter (Signed)
Requested medication (s) are due for refill today: yes  Requested medication (s) are on the active medication list: {yes  Last refill: 04/24/20  #30  0 refills  Future visit scheduled:yes  Notes to clinic:Medication not delegated ,needs urine drug screen, needs to be seen every 3 months    Requested Prescriptions  Pending Prescriptions Disp Refills   methylphenidate (CONCERTA) 27 MG PO CR tablet 30 tablet 0    Sig: Take 1 tablet (27 mg total) by mouth daily.      Not Delegated - Psychiatry:  Stimulants/ADHD Failed - 05/24/2020 12:50 PM      Failed - This refill cannot be delegated      Failed - Urine Drug Screen completed in last 360 days.      Failed - Valid encounter within last 3 months    Recent Outpatient Visits           6 months ago Essential hypertension   [facility], Jodelle Gross, FNP   11 months ago Essential hypertension   [facility] Kyung Rudd, Alison Stalling, NP   1 year ago Encounter for annual physical exam   [facility] Ky[facility] Rudd, Alison Stalling, NP   1 year ago Fragile X syndrome   [facility] Smitty Cords, DO   1 year ago Need for diphtheria-tetanus-pertussis (Tdap) vaccine   [facility] Kyung Rudd, Alison Stalling, NP       Future Appointments             In 1 week Malfi, Jodelle Gross, FNP [facility], [facility]

## 2020-05-24 NOTE — Telephone Encounter (Signed)
Medication Refill - Medication: Concerta  Has the patient contacted their pharmacy? No. (Agent: If no, request that the patient contact the pharmacy for the refill.) (Agent: If yes, when and what did the pharmacy advise?)  Preferred Pharmacy (with phone number or street name): [facility] [place]  - [address]  Agent: Please be advised that RX refills may take up to 3 business days. We ask that you follow-up with your pharmacy.

## 2020-06-02 ENCOUNTER — Ambulatory Visit (INDEPENDENT_AMBULATORY_CARE_PROVIDER_SITE_OTHER): Payer: Medicaid Other | Admitting: Family Medicine

## 2020-06-02 ENCOUNTER — Other Ambulatory Visit: Payer: Self-pay | Admitting: Family Medicine

## 2020-06-02 ENCOUNTER — Other Ambulatory Visit: Payer: Self-pay

## 2020-06-02 ENCOUNTER — Encounter: Payer: Self-pay | Admitting: Family Medicine

## 2020-06-02 VITALS — BP 126/76 | HR 102 | Temp 98.2°F | Resp 18 | Ht 69.0 in | Wt 160.8 lb

## 2020-06-02 DIAGNOSIS — F902 Attention-deficit hyperactivity disorder, combined type: Secondary | ICD-10-CM

## 2020-06-02 DIAGNOSIS — E7800 Pure hypercholesterolemia, unspecified: Secondary | ICD-10-CM | POA: Insufficient documentation

## 2020-06-02 DIAGNOSIS — Z23 Encounter for immunization: Secondary | ICD-10-CM | POA: Diagnosis not present

## 2020-06-02 DIAGNOSIS — I10 Essential (primary) hypertension: Secondary | ICD-10-CM

## 2020-06-02 DIAGNOSIS — E785 Hyperlipidemia, unspecified: Secondary | ICD-10-CM | POA: Insufficient documentation

## 2020-06-02 DIAGNOSIS — Q992 Fragile X chromosome: Secondary | ICD-10-CM

## 2020-06-02 DIAGNOSIS — Z79899 Other long term (current) drug therapy: Secondary | ICD-10-CM | POA: Diagnosis not present

## 2020-06-02 LAB — COMPLETE METABOLIC PANEL WITH GFR
AG Ratio: 2.2 (calc) (ref 1.0–2.5)
ALT: 8 U/L — ABNORMAL LOW (ref 9–46)
AST: 11 U/L (ref 10–40)
Albumin: 4.8 g/dL (ref 3.6–5.1)
Alkaline phosphatase (APISO): 60 U/L (ref 36–130)
BUN: 24 mg/dL (ref 7–25)
CO2: 27 mmol/L (ref 20–32)
Calcium: 9.6 mg/dL (ref 8.6–10.3)
Chloride: 104 mmol/L (ref 98–110)
Creat: 1.29 mg/dL (ref 0.60–1.35)
GFR, Est African American: 83 mL/min/{1.73_m2} (ref >=60)
GFR, Est Non African American: 72 mL/min/{1.73_m2} (ref >=60)
Globulin: 2.2 g/dL (calc) (ref 1.9–3.7)
Glucose, Bld: 98 mg/dL (ref 65–99)
Potassium: 4 mmol/L (ref 3.5–5.3)
Sodium: 140 mmol/L (ref 135–146)
Total Bilirubin: 0.7 mg/dL (ref 0.2–1.2)
Total Protein: 7 g/dL (ref 6.1–8.1)

## 2020-06-02 LAB — LIPID PANEL
Cholesterol: 142 mg/dL (ref <200)
HDL: 43 mg/dL (ref >=40)
LDL Cholesterol (Calc): 88 mg/dL (calc)
Non-HDL Cholesterol (Calc): 99 mg/dL (calc) (ref <130)
Total CHOL/HDL Ratio: 3.3 (calc) (ref <5.0)
Triglycerides: 38 mg/dL (ref <150)

## 2020-06-02 LAB — CBC WITH DIFFERENTIAL/PLATELET
Absolute Monocytes: 539 cells/uL (ref 200–950)
Basophils Absolute: 31 cells/uL (ref 0–200)
Basophils Relative: 0.5 %
Eosinophils Absolute: 19 cells/uL (ref 15–500)
Eosinophils Relative: 0.3 %
HCT: 43.6 % (ref 38.5–50.0)
Hemoglobin: 15 g/dL (ref 13.2–17.1)
Lymphs Abs: 1717 cells/uL (ref 850–3900)
MCH: 29.7 pg (ref 27.0–33.0)
MCHC: 34.4 g/dL (ref 32.0–36.0)
MCV: 86.3 fL (ref 80.0–100.0)
MPV: 10.7 fL (ref 7.5–12.5)
Monocytes Relative: 8.7 %
Neutro Abs: 3894 cells/uL (ref 1500–7800)
Neutrophils Relative %: 62.8 %
Platelets: 195 10*3/uL (ref 140–400)
RBC: 5.05 10*6/uL (ref 4.20–5.80)
RDW: 12.2 % (ref 11.0–15.0)
Total Lymphocyte: 27.7 %
WBC: 6.2 10*3/uL (ref 3.8–10.8)

## 2020-06-02 LAB — TSH+FREE T4: TSH W/REFLEX TO FT4: 0.94 mIU/L (ref 0.40–4.50)

## 2020-06-02 MED ORDER — METHYLPHENIDATE HCL ER (OSM) 27 MG PO TBCR: 27.0000 mg | EXTENDED_RELEASE_TABLET | Freq: Every day | ORAL | 0 refills | Status: DC

## 2020-06-02 MED ORDER — METHYLPHENIDATE HCL ER (OSM) 27 MG PO TBCR: 27.0000 mg | EXTENDED_RELEASE_TABLET | ORAL | 0 refills | Status: DC

## 2020-06-02 NOTE — Assessment & Plan Note (Signed)
Long term use of Concerta.  Will have labs drawn for evaluation of lipids and thyroid function.

## 2020-06-02 NOTE — Patient Instructions (Signed)
Have your labs drawn today and we will contact you with the results.  We have given you your influenza vaccine today.  Continue all medications as directed.  Try to get exercise a minimum of 30 minutes per day at least 5 days per week as well as  adequate water intake all while measuring blood pressure a few times per week.  Keep a blood pressure log and bring back to clinic at your next visit.  If your readings are consistently over 130/80 to contact our office/send me a MyChart message and we will see you sooner.  Can try DASH and Mediterranean diet options, avoiding processed foods, lowering sodium intake, avoiding pork products, and eating a plant based diet for optimal health.  We will plan to see you back in 6 months for hypertension and ADHD follow up visit  You will receive a survey after today's visit either digitally by e-mail or paper by USPS mail. Your experiences and feedback matter to us.  Please respond so we know how we are doing as we provide care for you.  Call us with any questions/concerns/needs.  It is my goal to be available to you for your health concerns.  Thanks for choosing me to be a partner in your healthcare needs!  Jabree Rebert Marie Ilana Prezioso, FNP-C Family Nurse Practitioner [facility] Phone: [phone] 

## 2020-06-02 NOTE — Assessment & Plan Note (Signed)
Currently stable on Concerta 27mg  daily.  Plan: 1. Have labs completed that were ordered today 2. Continue Concerta 27mg  once daily.  Reviewed signed controlled substances contract on file verbally. 3. Continue to participate in household chores. 4. Follow up in 6 months.  If any worsening of symptoms will discuss need for referral to neurology/psychiatry.

## 2020-06-02 NOTE — Telephone Encounter (Signed)
Requested medication (s) are due for refill today: yes  Requested medication (s) are on the active medication list: yes  Last refill:  ?  Future visit scheduled: no  Notes to clinic:  not delegated    Requested Prescriptions  Pending Prescriptions Disp Refills   CONCERTA 27 MG CR tablet [Pharmacy Med Name: Concerta 27 mg tablet,extended release] 30 tablet 0    Sig: TAKE ONE TABLET BY MOUTH ONCE DAILY      Not Delegated - Psychiatry:  Stimulants/ADHD Failed - 06/02/2020  9:08 AM      Failed - This refill cannot be delegated      Failed - Urine Drug Screen completed in last 360 days.      Passed - Valid encounter within last 3 months    Recent Outpatient Visits           Today Attention deficit hyperactivity disorder (ADHD), combined type   [facility], Jodelle Gross, FNP   6 months ago Essential hypertension   [facility], Jodelle Gross, FNP   11 months ago Essential hypertension   [facility] Kyung Rudd, Alison Stalling, NP   1 year ago Encounter for annual physical exam   [facility] Kyung Rudd, Alison Stalling, NP   1 year ago Fragile X syndrome   [facility], Netta Neat, DO               Signed Prescriptions Disp Refills   lisinopril (ZESTRIL) 20 MG tablet 90 tablet 1    Sig: TAKE ONE TABLET BY MOUTH ONCE DAILY      Cardiovascular:  ACE Inhibitors Failed - 06/02/2020  9:08 AM      Failed - Cr in normal range and within 180 days    Creat  Date Value Ref Range Status  03/24/2019 1.14 0.60 - 1.35 mg/dL Final          Failed - K in normal range and within 180 days    Potassium  Date Value Ref Range Status  03/24/2019 4.1 3.5 - 5.3 mmol/L Final          Passed - Patient is not pregnant      Passed - Last BP in normal range    BP Readings from Last 1 Encounters:  06/02/20 126/76          Passed - Valid encounter within last 6 months    Recent Outpatient Visits            Today Attention deficit hyperactivity disorder (ADHD), combined type   [facility], Jodelle Gross, FNP   6 months ago Essential hypertension   [facility], Jodelle Gross, FNP   11 months ago Essential hypertension   [facility] Kyung Rudd, Alison Stalling, NP   1 year ago Encounter for annual physical exam   [facility] Kyung Rudd, Alison Stalling, NP   1 year ago Fragile X syndrome   [facility], Netta Neat, DO

## 2020-06-02 NOTE — Telephone Encounter (Signed)
Requested Prescriptions  Pending Prescriptions Disp Refills  . lisinopril (ZESTRIL) 20 MG tablet [Pharmacy Med Name: lisinopril 20 mg tablet] 90 tablet 1    Sig: TAKE ONE TABLET BY MOUTH ONCE DAILY     Cardiovascular:  ACE Inhibitors Failed - 06/02/2020  9:08 AM      Failed - Cr in normal range and within 180 days    Creat  Date Value Ref Range Status  03/24/2019 1.14 0.60 - 1.35 mg/dL Final         Failed - K in normal range and within 180 days    Potassium  Date Value Ref Range Status  03/24/2019 4.1 3.5 - 5.3 mmol/L Final         Passed - Patient is not pregnant      Passed - Last BP in normal range    BP Readings from Last 1 Encounters:  06/02/20 126/76         Passed - Valid encounter within last 6 months    Recent Outpatient Visits          Today Needs flu shot   [facility], Jodelle Gross, FNP   6 months ago Essential hypertension   [facility], Jodelle [facility], FNP   11 months ago Essential hypertension   [facility] Kyung Rudd, Alison Stalling, NP   1 year ago Encounter for annual physical exam   [facility] Kyung Rudd, Alison Stalling, NP   1 year ago Fragile X syndrome   [facility], Alexander J, DO             . CONCERTA 27 MG CR tablet [Pharmacy Med Name: Concerta 27 mg tablet,extended release] 30 tablet 0    Sig: TAKE ONE TABLET BY MOUTH ONCE DAILY     Not Delegated - Psychiatry:  Stimulants/ADHD Failed - 06/02/2020  9:08 AM      Failed - This refill cannot be delegated      Failed - Urine Drug Screen completed in last 360 days.      Passed - Valid encounter within last 3 months    Recent Outpatient Visits          Today Needs flu shot   [facility], Jodelle Gross, FNP   6 months ago Essential hypertension   [facility], Jodelle Gross, FNP   11 months ago Essential hypertension   [facility] Kyung Rudd, Alison Stalling,  NP   1 year ago Encounter for annual physical exam   [facility] [place], NP   1 year ago Fragile X syndrome   [facility], Netta Neat, DO

## 2020-06-02 NOTE — Assessment & Plan Note (Signed)
Status unknown.  Recheck labs.  Followup after labs.  

## 2020-06-02 NOTE — Assessment & Plan Note (Signed)
Controlled hypertension.  BP is at goal < 130/80.  Pt is not working on lifestyle modifications.  Taking medications tolerating well without side effects.  Complications:  ADHD  Plan: 1. Continue taking lisinopril 20mg  daily 2. Obtain labs today  3. Encouraged heart healthy diet and increasing exercise to 30 minutes most days of the week, going no more than 2 days in a row without exercise. 4. Check BP 1-2 x per week at home, keep log, and bring to clinic at next appointment. 5. Follow up 6 months.

## 2020-06-02 NOTE — Telephone Encounter (Signed)
Requested medication (s) are due for refill today: yes  Requested medication (s) are on the active medication list: yes  Last refill: ?  Future visit scheduled: no  Notes to clinic:  not delegated    Requested Prescriptions  Pending Prescriptions Disp Refills   CONCERTA 27 MG CR tablet [Pharmacy Med Name: Concerta 27 mg tablet,extended release] 30 tablet 0    Sig: TAKE ONE TABLET BY MOUTH ONCE DAILY      Not Delegated - Psychiatry:  Stimulants/ADHD Failed - 06/02/2020  9:08 AM      Failed - This refill cannot be delegated      Failed - Urine Drug Screen completed in last 360 days.      Passed - Valid encounter within last 3 months    Recent Outpatient Visits           Today Needs flu shot   [facility] [place], FNP   6 months ago Essential hypertension   [facility], Jodelle Gross, FNP   11 months ago Essential hypertension   [facility] Kyung Rudd, Alison Stalling, NP   1 year ago Encounter for annual physical exam   [facility] Kyung Rudd, Alison Stalling, NP   1 year ago Fragile X syndrome   [facility], Netta Neat, DO               Signed Prescriptions Disp Refills   lisinopril (ZESTRIL) 20 MG tablet 90 tablet 1    Sig: TAKE ONE TABLET BY MOUTH ONCE DAILY      Cardiovascular:  ACE Inhibitors Failed - 06/02/2020  9:08 AM      Failed - Cr in normal range and within 180 days    Creat  Date Value Ref Range Status  03/24/2019 1.14 0.60 - 1.35 mg/dL Final          Failed - K in normal range and within 180 days    Potassium  Date Value Ref Range Status  03/24/2019 4.1 3.5 - 5.3 mmol/L Final          Passed - Patient is not pregnant      Passed - Last BP in normal range    BP Readings from Last 1 Encounters:  06/02/20 126/76          Passed - Valid encounter within last 6 months    Recent Outpatient Visits           Today Needs flu shot   [facility], Jodelle Gross, FNP   6 months ago Essential hypertension   [facility], Jodelle Gross, FNP   11 months ago Essential hypertension   [facility] Kyung Rudd, Alison Stalling, NP   1 year ago Encounter for annual physical exam   [facility] Kyung Rudd, Alison Stalling, NP   1 year ago Fragile X syndrome   [facility], Netta Neat, DO

## 2020-06-02 NOTE — Assessment & Plan Note (Signed)
Pt < age 34.  Needs annual influenza vaccine.  VIS provided.  Plan: 1. Administer Quad flu vaccine.  

## 2020-06-02 NOTE — Progress Notes (Signed)
Subjective:    Patient ID: Ian Lam, male    DOB: 01-Sep-1985, 34 y.o.   MRN: [id number]  Ian Lam is a 34 y.o. male presenting on 06/02/2020 for ADHD and Hypertension   HPI   Mr. Ian Lam presents to clinic for follow up on his hypertension, ADHD and for influenza vaccination today.  Aunt presents with Mr. Ian Lam and reports that he has been taking his medications, tolerating well, without acute concerns today.  Hypertension - He is not checking BP at home or outside of clinic.    - Current medications: lisinopril 20mg  daily, tolerating well without side effects - He is not currently symptomatic. - Pt denies headache, lightheadedness, dizziness, changes in vision, chest tightness/pressure, palpitations, leg swelling, sudden loss of speech or loss of consciousness. - He  reports no regular exercise routine. - His diet is high in salt, high in fat, and high in carbohydrates.  Depression screen [facility] 2/9 11/17/2019 04/06/2018  Decreased Interest 0 0  Down, Depressed, Hopeless 0 0  PHQ - 2 Score 0 0    Social History   Tobacco Use  . Smoking status: Never Smoker  . Smokeless tobacco: Never Used  Vaping Use  . Vaping Use: Never used  Substance Use Topics  . Alcohol use: Never  . Drug use: Never    Review of Systems  Constitutional: Negative.   HENT: Negative.   Eyes: Negative.   Respiratory: Negative.   Cardiovascular: Negative.   Gastrointestinal: Negative.   Endocrine: Negative.   Genitourinary: Negative.   Musculoskeletal: Negative.   Skin: Negative.   Allergic/Immunologic: Negative.   Neurological: Negative.   Hematological: Negative.   Psychiatric/Behavioral: Negative.    Per HPI unless specifically indicated above     Objective:    BP 126/76 (BP Location: Right Arm, Patient Position: Sitting, Cuff Size: Normal)   Pulse (!) 102   Temp 98.2 F (36.8 C)   Resp 18   Ht 5\' 9"  (1.753 m)   Wt 160 lb 12.8 oz (72.9 kg)   SpO2 100%   BMI 23.75 kg/m     Wt Readings from Last 3 Encounters:  06/02/20 160 lb 12.8 oz (72.9 kg)  11/17/19 160 lb 12.8 oz (72.9 kg)  06/14/19 158 lb 12.8 oz (72 kg)    Physical Exam Vitals and nursing note reviewed.  Constitutional:      General: He is not in acute distress.    Appearance: Normal appearance. He is well-developed, well-groomed and normal weight. He is not ill-appearing or toxic-appearing.  HENT:     Head: Normocephalic and atraumatic.     Nose:     Comments: 11/19/19 is in place, covering mouth and nose. Eyes:     General:        Right eye: No discharge.        Left eye: No discharge.     Extraocular Movements: Extraocular movements intact.     Conjunctiva/sclera: Conjunctivae normal.     Pupils: Pupils are equal, round, and reactive to light.  Cardiovascular:     Rate and Rhythm: Normal rate and regular rhythm.     Pulses: Normal pulses.     Heart sounds: Normal heart sounds. No murmur heard.  No friction rub. No gallop.   Pulmonary:     Effort: Pulmonary effort is normal. No respiratory distress.     Breath sounds: Normal breath sounds.  Musculoskeletal:     Right lower leg: No edema.     Left lower leg:  No edema.  Skin:    General: Skin is warm and dry.     Capillary Refill: Capillary refill takes less than 2 seconds.  Neurological:     General: No focal deficit present.     Mental Status: He is alert and oriented to person, place, and time.  Psychiatric:        Attention and Perception: Attention and perception normal.        Mood and Affect: Mood and affect normal.        Speech: Speech normal.        Behavior: Behavior is hyperactive. Behavior is cooperative.        Thought Content: Thought content normal.    Results for orders placed or performed in visit on 11/17/19  POCT Urinalysis Dipstick  Result Value Ref Range   Color, UA yellow    Clarity, UA clear    Glucose, UA Negative Negative   Bilirubin, UA small    Ketones, UA negative    Spec Grav, UA 1.010 1.010 -  1.025   Blood, UA negative    pH, UA 8.5 (A) 5.0 - 8.0   Protein, UA Negative Negative   Urobilinogen, UA 0.2 0.2 or 1.0 E.U./dL   Nitrite, UA negative    Leukocytes, UA Negative Negative   Appearance     Odor    POCT UA - Microalbumin  Result Value Ref Range   Microalbumin Ur, POC 20 mg/L   Creatinine, POC     Albumin/Creatinine Ratio, Urine, POC        Assessment & Plan:   Problem List Items Addressed This Visit      Cardiovascular and Mediastinum   Hypertension    Controlled hypertension.  BP is at goal < 130/80.  Pt is not working on lifestyle modifications.  Taking medications tolerating well without side effects.  Complications:  ADHD  Plan: 1. Continue taking lisinopril 20mg  daily 2. Obtain labs today  3. Encouraged heart healthy diet and increasing exercise to 30 minutes most days of the week, going no more than 2 days in a row without exercise. 4. Check BP 1-2 x per week at home, keep log, and bring to clinic at next appointment. 5. Follow up 6 months.       Relevant Orders   CBC with Differential   COMPLETE METABOLIC PANEL WITH GFR     Other   ADHD - Primary    Currently stable on Concerta 27mg  daily.  Plan: 1. Have labs completed that were ordered today 2. Continue Concerta 27mg  once daily.  Reviewed signed controlled substances contract on file verbally. 3. Continue to participate in household chores. 4. Follow up in 6 months.  If any worsening of symptoms will discuss need for referral to neurology/psychiatry.      Relevant Medications   methylphenidate (CONCERTA) 27 MG PO CR tablet (Start on 06/23/2020)   Needs flu shot    Pt < age 38.  Needs annual influenza vaccine.  VIS provided.  Plan: 1. Administer Quad flu vaccine.       Relevant Orders   Flu Vaccine QUAD 6+ mos PF IM (Fluarix Quad PF)   Elevated LDL cholesterol level    Status unknown.  Recheck labs.  Followup after labs.       Relevant Orders   Lipid Profile   Long-term use of  high-risk medication    Long term use of Concerta.  Will have labs drawn for evaluation of lipids and thyroid function.  Relevant Orders   TSH + free T4      Meds ordered this encounter  Medications  . methylphenidate (CONCERTA) 27 MG PO CR tablet    Sig: Take 1 tablet (27 mg total) by mouth daily.    Dispense:  30 tablet    Refill:  0  . DISCONTD: methylphenidate (CONCERTA) 27 MG PO CR tablet    Sig: Take 1 tablet (27 mg total) by mouth every morning.    Dispense:  30 tablet    Refill:  0  . DISCONTD: methylphenidate (CONCERTA) 27 MG PO CR tablet    Sig: Take 1 tablet (27 mg total) by mouth every morning.    Dispense:  30 tablet    Refill:  0    Follow up plan: Return in about 6 months (around 12/01/2020) for ADHD & HTN F/U.   Charlaine Dalton, FNP Family Nurse Practitioner [facility] 06/02/2020, 10:00 AM

## 2020-09-25 ENCOUNTER — Other Ambulatory Visit: Payer: Self-pay | Admitting: Family Medicine

## 2020-09-25 DIAGNOSIS — J302 Other seasonal allergic rhinitis: Secondary | ICD-10-CM

## 2020-09-25 DIAGNOSIS — F902 Attention-deficit hyperactivity disorder, combined type: Secondary | ICD-10-CM

## 2020-09-25 MED ORDER — LORATADINE 10 MG PO TABS: 10.0000 mg | ORAL_TABLET | Freq: Every day | ORAL | 1 refills | Status: DC

## 2020-09-25 NOTE — Telephone Encounter (Signed)
Medication Refill - Medication: methylphenidate (CONCERTA) 27 MG PO CR tablet   Has the patient contacted their pharmacy? No. (Agent: If no, request that the patient contact the pharmacy for the refill.) (Agent: If yes, when and what did the pharmacy advise?)  Preferred Pharmacy (with phone number or street name): [facility] - [place], [place] - [address]  [address] [place] [address]  Phone:  [phone] Fax:  [phone]   Agent: Please be advised that RX refills may take up to 3 business days. We ask that you follow-up with your pharmacy.

## 2020-09-27 ENCOUNTER — Other Ambulatory Visit: Payer: Self-pay

## 2020-09-27 DIAGNOSIS — F902 Attention-deficit hyperactivity disorder, combined type: Secondary | ICD-10-CM

## 2020-09-27 MED ORDER — METHYLPHENIDATE HCL ER (OSM) 27 MG PO TBCR: 27.0000 mg | EXTENDED_RELEASE_TABLET | Freq: Every day | ORAL | 0 refills | Status: DC

## 2020-10-03 ENCOUNTER — Ambulatory Visit: Payer: Medicaid Other | Admitting: Unknown Physician Specialty

## 2020-10-20 ENCOUNTER — Other Ambulatory Visit: Payer: Self-pay

## 2020-10-20 DIAGNOSIS — F902 Attention-deficit hyperactivity disorder, combined type: Secondary | ICD-10-CM

## 2020-10-20 MED ORDER — METHYLPHENIDATE HCL ER (OSM) 27 MG PO TBCR: 27.0000 mg | EXTENDED_RELEASE_TABLET | Freq: Every day | ORAL | 0 refills | Status: DC

## 2020-11-16 ENCOUNTER — Telehealth: Payer: Self-pay | Admitting: Family Medicine

## 2020-11-16 DIAGNOSIS — F902 Attention-deficit hyperactivity disorder, combined type: Secondary | ICD-10-CM

## 2020-11-16 MED ORDER — METHYLPHENIDATE HCL ER (OSM) 27 MG PO TBCR: 27.0000 mg | EXTENDED_RELEASE_TABLET | Freq: Every day | ORAL | 0 refills | Status: DC

## 2020-11-16 NOTE — Telephone Encounter (Signed)
Medication Refill - Medication: Concerta 35 mg  Has the patient contacted their pharmacy? No. (Agent: If no, request that the patient contact the pharmacy for the refill.) (Agent: If yes, when and what did the pharmacy advise?)  Preferred Pharmacy (with phone number or street name): [facility]  Agent: Please be advised that RX refills may take up to 3 business days. We ask that you follow-up with your pharmacy.

## 2020-11-17 ENCOUNTER — Other Ambulatory Visit: Payer: Self-pay

## 2020-11-17 DIAGNOSIS — I10 Essential (primary) hypertension: Secondary | ICD-10-CM

## 2020-11-17 MED ORDER — LISINOPRIL 20 MG PO TABS: 20.0000 mg | ORAL_TABLET | Freq: Every day | ORAL | 3 refills | Status: DC

## 2020-12-08 ENCOUNTER — Ambulatory Visit: Payer: Medicaid Other | Admitting: Family Medicine

## 2020-12-18 ENCOUNTER — Other Ambulatory Visit: Payer: Self-pay | Admitting: Family Medicine

## 2020-12-18 DIAGNOSIS — F902 Attention-deficit hyperactivity disorder, combined type: Secondary | ICD-10-CM

## 2020-12-18 NOTE — Telephone Encounter (Signed)
Would you mind refilling the controlled's until I see the patients initially? 

## 2020-12-18 NOTE — Telephone Encounter (Signed)
Requested medication (s) are due for refill today:   Provider to review  Requested medication (s) are on the active medication list:   Yes  Future visit scheduled:   Yes with Nicki Reaper   Last ordered: 11/16/2020 #30, 0 refills  Non delegated refill    Requested Prescriptions  Pending Prescriptions Disp Refills   CONCERTA 27 MG CR tablet [Pharmacy Med Name: Concerta 27 mg tablet,extended release] 30 tablet 0    Sig: TAKE ONE TABLET BY MOUTH ONCE DAILY      Not Delegated - Psychiatry:  Stimulants/ADHD Failed - 12/18/2020  9:57 AM      Failed - This refill cannot be delegated      Failed - Urine Drug Screen completed in last 360 days      Failed - Valid encounter within last 3 months    Recent Outpatient Visits           6 months ago Attention deficit hyperactivity disorder (ADHD), combined type   [facility], Jodelle Gross, FNP   1 year ago Essential hypertension   [facility], Jodelle Gross, FNP   1 year ago Essential hypertension   [facility] Galen Manila, NP   1 year ago Encounter for annual physical exam   [facility] Galen Manila, NP   1 year ago Fragile X syndrome   [facility] Althea Charon, Netta Neat, DO       Future Appointments             In 1 week Sampson Si, Salvadore Oxford, NP [facility], [facility]

## 2020-12-26 ENCOUNTER — Ambulatory Visit: Payer: Medicaid Other | Admitting: Internal Medicine

## 2020-12-26 ENCOUNTER — Other Ambulatory Visit: Payer: Self-pay

## 2020-12-26 ENCOUNTER — Encounter: Payer: Self-pay | Admitting: Internal Medicine

## 2020-12-26 DIAGNOSIS — I10 Essential (primary) hypertension: Secondary | ICD-10-CM

## 2020-12-26 DIAGNOSIS — F902 Attention-deficit hyperactivity disorder, combined type: Secondary | ICD-10-CM

## 2020-12-26 DIAGNOSIS — E7800 Pure hypercholesterolemia, unspecified: Secondary | ICD-10-CM | POA: Diagnosis not present

## 2020-12-26 NOTE — Patient Instructions (Signed)

## 2020-12-26 NOTE — Assessment & Plan Note (Signed)
Controlled on Lisinopril Reinforced DASH diet 

## 2020-12-26 NOTE — Progress Notes (Signed)
Subjective:    Patient ID: Ian Lam, male    DOB: [birth year], 35 y.o.   MRN: [id number]  HPI  Patient presents to clinic today for follow-up of chronic conditions.  He is establishing care with me today, transferring care from Malva Cogan, NP.  HTN: His BP today is 122/69.  He is taking Lisinopril as prescribed.  There is no ECG on file.  ADHD: Managed on Concerta with good results.  He is not following with psychiatry.  HLD: His last LDL was 88, triglycerides 38, 05/2020.  He is not taking any cholesterol-lowering medication at this time.  He tries to consume a low-fat diet.  Review of Systems      Past Medical History:  Diagnosis Date  . ADHD   . Allergy   . 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 Hypertension     Current Outpatient Medications  Medication Sig Dispense Refill  . CONCERTA 27 MG CR tablet TAKE ONE TABLET BY MOUTH ONCE DAILY 30 tablet 0  . lisinopril (ZESTRIL) 20 MG tablet Take 1 tablet (20 mg total) by mouth daily. 90 tablet 3  . loratadine (CLARITIN) 10 MG tablet Take 1 tablet (10 mg total) by mouth daily. 90 tablet 1   No current facility-administered medications for this visit.    No Known Allergies  Family History  Problem Relation Age of Onset  . Fragile X syndrome Brother     Social History   Socioeconomic History  . Marital status: Single    Spouse name: Not on file  . Number of children: Not on file  . Years of education: 75  . Highest education level: Not on file  Occupational History  . Not on file  Tobacco Use  . Smoking status: Never Smoker  . Smokeless tobacco: Never Used  Vaping Use  . Vaping Use: Never used  Substance and Sexual Activity  . Alcohol use: Never  . Drug use: Never  . Sexual activity: Not Currently  Other Topics Concern  . Not on file  Social History Narrative  . Not on file   Social Determinants of Health   Financial Resource Strain: Not on file  Food Insecurity: Not on file  Transportation Needs: Not  on file  Physical Activity: Not on file  Stress: Not on file  Social Connections: Not on file  Intimate Partner Violence: Not on file     Constitutional: Denies fever, malaise, fatigue, headache or abrupt weight changes.  Respiratory: Denies difficulty breathing, shortness of breath, cough or sputum production.   Cardiovascular: Denies chest pain, chest tightness, palpitations or swelling in the hands or feet.  Neurological: Pt reports difficulty focusing. Denies dizziness or problems with balance and coordination.    No other specific complaints in a complete review of systems (except as listed in HPI above).  Objective:   Physical Exam  BP 122/69 (BP Location: Left Arm, Patient Position: Sitting, Cuff Size: Normal)   Pulse 82   Temp 98.2 F (36.8 C) (Temporal)   Resp 17   Ht 5\' 9"  (1.753 m)   Wt 168 lb 9.6 oz (76.5 kg)   SpO2 100%   BMI 24.90 kg/m   Wt Readings from Last 3 Encounters:  06/02/20 160 lb 12.8 oz (72.9 kg)  11/17/19 160 lb 12.8 oz (72.9 kg)  06/14/19 158 lb 12.8 oz (72 kg)    General: Appears his stated age, disabled, in NAD. Skin: Warm, dry and intact. No rashes noted. HEENT: Head: normal  shape and size; Eyes: sclera white and EOMs intact;  Cardiovascular: Normal rate and rhythm. S1,S2 noted.  No murmur, rubs or gallops noted. No JVD or BLE edema.  Pulmonary/Chest: Normal effort and positive vesicular breath sounds. No respiratory distress. No wheezes, rales or ronchi noted.  Neurological: Alert.  BMET    Component Value Date/Time   NA 140 06/02/2020 0954   K 4.0 06/02/2020 0954   CL 104 06/02/2020 0954   CO2 27 06/02/2020 0954   GLUCOSE 98 06/02/2020 0954   BUN 24 06/02/2020 0954   CREATININE 1.29 06/02/2020 0954   CALCIUM 9.6 06/02/2020 0954   GFRNONAA 72 06/02/2020 0954   GFRAA 83 06/02/2020 0954    Lipid Panel     Component Value Date/Time   CHOL 142 06/02/2020 0954   TRIG 38 06/02/2020 0954   HDL 43 06/02/2020 0954   CHOLHDL 3.3  06/02/2020 0954   LDLCALC 88 06/02/2020 0954    CBC    Component Value Date/Time   WBC 6.2 06/02/2020 0954   RBC 5.05 06/02/2020 0954   HGB 15.0 06/02/2020 0954   HCT 43.6 06/02/2020 0954   PLT 195 06/02/2020 0954   MCV 86.3 06/02/2020 0954   MCH 29.7 06/02/2020 0954   MCHC 34.4 06/02/2020 0954   RDW 12.2 06/02/2020 0954   LYMPHSABS 1,717 06/02/2020 0954   EOSABS 19 06/02/2020 0954   BASOSABS 31 06/02/2020 0954    Hgb A1C No results found for: HGBA1C          Assessment & Plan:    Nicki Reaper, NP This visit occurred during the SARS-CoV-2 public health emergency.  Safety protocols were in place, including screening questions prior to the visit, additional usage of staff PPE, and extensive cleaning of exam room while observing appropriate contact time as indicated for disinfecting solutions.

## 2020-12-26 NOTE — Assessment & Plan Note (Signed)
Stable on current dose of Concerta Will monitor

## 2020-12-26 NOTE — Assessment & Plan Note (Signed)
Encouraged him to consume a low-fat diet Will monitor 

## 2021-01-23 ENCOUNTER — Other Ambulatory Visit: Payer: Self-pay | Admitting: Internal Medicine

## 2021-01-23 DIAGNOSIS — F902 Attention-deficit hyperactivity disorder, combined type: Secondary | ICD-10-CM

## 2021-01-23 MED ORDER — METHYLPHENIDATE HCL ER (OSM) 27 MG PO TBCR: 27.0000 mg | EXTENDED_RELEASE_TABLET | Freq: Every day | ORAL | 0 refills | Status: DC

## 2021-01-23 NOTE — Telephone Encounter (Signed)
Requested medication (s) are due for refill today:  yes  Requested medication (s) are on the active medication list: yes  Last refill: 12/18/2020  Future visit scheduled: yes  Notes to clinic: this refill cannot be delegated    Requested Prescriptions  Pending Prescriptions Disp Refills   methylphenidate (CONCERTA) 27 MG PO CR tablet 30 tablet 0    Sig: Take 1 tablet (27 mg total) by mouth daily.      Not Delegated - Psychiatry:  Stimulants/ADHD Failed - 01/23/2021  9:44 AM      Failed - This refill cannot be delegated      Failed - Urine Drug Screen completed in last 360 days      Passed - Valid encounter within last 3 months    Recent Outpatient Visits           4 weeks ago Primary hypertension   [facility], Salvadore Oxford, NP   7 months ago Attention deficit hyperactivity disorder (ADHD), combined type   [facility], Jodelle Gross, FNP   1 year ago Essential hypertension   [facility], Jodelle Gross, FNP   1 year ago Essential hypertension   [facility] Galen Manila, NP   1 year ago Encounter for annual physical exam   [facility] Galen Manila, NP       Future Appointments             In 5 months Baity, Salvadore Oxford, NP [facility], [facility]

## 2021-01-23 NOTE — Telephone Encounter (Signed)
Medication Refill - Medication: CONCERTA 27 MG CR tablet    Preferred Pharmacy (with phone number or street name): [facility] - [place], [place] - [address]  Agent: Please be advised that RX refills may take up to 3 business days. We ask that you follow-up with your pharmacy.

## 2021-02-15 ENCOUNTER — Other Ambulatory Visit: Payer: Self-pay | Admitting: Internal Medicine

## 2021-02-15 DIAGNOSIS — F902 Attention-deficit hyperactivity disorder, combined type: Secondary | ICD-10-CM

## 2021-02-15 NOTE — Telephone Encounter (Signed)
Medication Refill - Medication: concerta 27 mg  Has the patient contacted their pharmacy? Yes told to call provider office ( Preferred Pharmacy (with phone number or street name): [facility]  [place] phone number [phone]  Agent: Please be advised that RX refills may take up to 3 business days. We ask that you follow-up with your pharmacy.

## 2021-02-20 ENCOUNTER — Other Ambulatory Visit: Payer: Self-pay

## 2021-02-20 DIAGNOSIS — F902 Attention-deficit hyperactivity disorder, combined type: Secondary | ICD-10-CM

## 2021-02-20 MED ORDER — METHYLPHENIDATE HCL ER (OSM) 27 MG PO TBCR
27.0000 mg | EXTENDED_RELEASE_TABLET | Freq: Every day | ORAL | 0 refills | Status: DC
Start: 2021-02-20 — End: 2021-03-22

## 2021-02-20 NOTE — Telephone Encounter (Signed)
Requested medication (s) are due for refill today: yes   Requested medication (s) are on the active medication list: yes   Last refill:  01/23/2021  Future visit scheduled: yes   Notes to clinic: this refill cannot be delegated    Requested Prescriptions  Pending Prescriptions Disp Refills   methylphenidate (CONCERTA) 27 MG PO CR tablet 30 tablet 0    Sig: Take 1 tablet (27 mg total) by mouth daily.      Not Delegated - Psychiatry:  Stimulants/ADHD Failed - 02/20/2021  1:02 PM      Failed - This refill cannot be delegated      Failed - Urine Drug Screen completed in last 360 days      Passed - Valid encounter within last 3 months    Recent Outpatient Visits           1 month ago Primary hypertension   [facility] [place], Salvadore Oxford, NP   8 months ago Attention deficit hyperactivity disorder (ADHD), combined type   [facility], Jodelle Gross, FNP   1 year ago Essential hypertension   [facility], Jodelle Gross, FNP   1 year ago Essential hypertension   [facility] Galen Manila, NP   1 year ago Encounter for annual physical exam   [facility] Galen Manila, NP       Future Appointments             In 4 months Baity, Salvadore Oxford, NP [facility], [facility]

## 2021-03-22 ENCOUNTER — Other Ambulatory Visit: Payer: Self-pay | Admitting: Internal Medicine

## 2021-03-22 DIAGNOSIS — F902 Attention-deficit hyperactivity disorder, combined type: Secondary | ICD-10-CM

## 2021-03-22 MED ORDER — METHYLPHENIDATE HCL ER (OSM) 27 MG PO TBCR: 27.0000 mg | EXTENDED_RELEASE_TABLET | Freq: Every day | ORAL | 0 refills | Status: DC

## 2021-03-22 NOTE — Telephone Encounter (Signed)
Medication: methylphenidate (CONCERTA) 27 MG PO CR tablet Has the pt contacted their pharmacy? Yes but not heard anything  Preferred pharmacy: [facility] - [place], [place] - [place] [address]   Please be advised refills may take up to 3 business days.  We ask that you follow up with your pharmacy.

## 2021-03-22 NOTE — Telephone Encounter (Signed)
Requested medication (s) are due for refill today:  yes   Requested medication (s) are on the active medication list: yes   Last refill:  02/20/2021  Future visit scheduled:  yes   Notes to clinic:  this refill cannot be delegated    Requested Prescriptions  Pending Prescriptions Disp Refills   methylphenidate (CONCERTA) 27 MG PO CR tablet 30 tablet 0    Sig: Take 1 tablet (27 mg total) by mouth daily.      Not Delegated - Psychiatry:  Stimulants/ADHD Failed - 03/22/2021  1:01 PM      Failed - This refill cannot be delegated      Failed - Urine Drug Screen completed in last 360 days      Passed - Valid encounter within last 3 months    Recent Outpatient Visits           2 months ago Primary hypertension   [facility], Salvadore Oxford, NP   9 months ago Attention deficit hyperactivity disorder (ADHD), combined type   [facility], Jodelle Gross, FNP   1 year ago Essential hypertension   [facility], Jodelle Gross, FNP   1 year ago Essential hypertension   [facility] Galen Manila, NP   1 year ago Encounter for annual physical exam   [facility] Galen Manila, NP       Future Appointments             In 3 months Baity, Salvadore Oxford, NP [facility], [facility]

## 2021-03-23 ENCOUNTER — Other Ambulatory Visit: Payer: Self-pay | Admitting: Family Medicine

## 2021-03-23 DIAGNOSIS — J302 Other seasonal allergic rhinitis: Secondary | ICD-10-CM

## 2021-04-17 ENCOUNTER — Other Ambulatory Visit: Payer: Self-pay | Admitting: Internal Medicine

## 2021-04-17 DIAGNOSIS — F902 Attention-deficit hyperactivity disorder, combined type: Secondary | ICD-10-CM

## 2021-04-17 NOTE — Telephone Encounter (Signed)
Requested medication (s) are due for refill today: yes  Requested medication (s) are on the active medication list: yes  Last refill:  03/22/21 #30  Future visit scheduled: yes  Notes to clinic:  med not delegated to NT to RF   Requested Prescriptions  Pending Prescriptions Disp Refills   CONCERTA 27 MG CR tablet [Pharmacy Med Name: Concerta 27 mg tablet,extended release] 30 tablet 0    Sig: TAKE ONE TABLET BY MOUTH ONCE DAILY     Not Delegated - Psychiatry:  Stimulants/ADHD Failed - 04/17/2021  9:09 AM      Failed - This refill cannot be delegated      Failed - Urine Drug Screen completed in last 360 days      Failed - Valid encounter within last 3 months    Recent Outpatient Visits           3 months ago Primary hypertension   [facility], Salvadore Oxford, NP   10 months ago Attention deficit hyperactivity disorder (ADHD), combined type   [facility], Jodelle Gross, FNP   1 year ago Essential hypertension   [facility], Jodelle Gross, FNP   1 year ago Essential hypertension   [facility] Galen Manila, NP   2 years ago Encounter for annual physical exam   [facility], Alison Stalling, NP       Future Appointments             In 2 months Baity, Salvadore Oxford, NP [facility], [facility]

## 2021-04-23 ENCOUNTER — Other Ambulatory Visit: Payer: Self-pay | Admitting: Family Medicine

## 2021-04-23 DIAGNOSIS — J302 Other seasonal allergic rhinitis: Secondary | ICD-10-CM

## 2021-04-23 NOTE — Telephone Encounter (Signed)
Request too soon.

## 2021-04-26 ENCOUNTER — Other Ambulatory Visit: Payer: Self-pay | Admitting: Family Medicine

## 2021-04-26 DIAGNOSIS — J302 Other seasonal allergic rhinitis: Secondary | ICD-10-CM

## 2021-05-08 ENCOUNTER — Other Ambulatory Visit: Payer: Self-pay | Admitting: Family Medicine

## 2021-05-08 DIAGNOSIS — J302 Other seasonal allergic rhinitis: Secondary | ICD-10-CM

## 2021-05-24 ENCOUNTER — Other Ambulatory Visit: Payer: Self-pay | Admitting: Internal Medicine

## 2021-05-24 DIAGNOSIS — F902 Attention-deficit hyperactivity disorder, combined type: Secondary | ICD-10-CM

## 2021-05-24 NOTE — Telephone Encounter (Signed)
Copied from CRM [id number]. Topic: Quick Communication - Rx Refill/Question >> May 24, 2021 11:07 AM Ian Lam wrote: Medication: CONCERTA 27 MG CR tablet [[id number]]   Has the patient contacted their pharmacy? Yes.   (Agent: If no, request that the patient contact the pharmacy for the refill.) (Agent: If yes, when and what did the pharmacy advise?)  Preferred Pharmacy (with phone number or street name): CONCERTA 27 MG CR tablet [[id number]]  Has the patient been seen for an appointment in the last year OR does the patient have an upcoming appointment? Yes.    Agent: Please be advised that RX refills may take up to 3 business days. We ask that you follow-up with your pharmacy.

## 2021-05-24 NOTE — Telephone Encounter (Signed)
Requested medication (s) are due for refill today:   Provider to review  Requested medication (s) are on the active medication list:   Yes  Future visit scheduled:   Yes   Last ordered: 04/20/2021 #30, 0 refills  Non delegated refill   Requested Prescriptions  Pending Prescriptions Disp Refills   methylphenidate (CONCERTA) 27 MG PO CR tablet 30 tablet 0    Sig: Take 1 tablet (27 mg total) by mouth daily.     Not Delegated - Psychiatry:  Stimulants/ADHD Failed - 05/24/2021  2:42 PM      Failed - This refill cannot be delegated      Failed - Urine Drug Screen completed in last 360 days      Failed - Valid encounter within last 3 months    Recent Outpatient Visits           4 months ago Primary hypertension   [facility] [place], Salvadore Oxford, NP   11 months ago Attention deficit hyperactivity disorder (ADHD), combined type   [facility], Jodelle Gross, FNP   1 year ago Essential hypertension   [facility], Jodelle Gross, FNP   1 year ago Essential hypertension   [facility] Galen Manila, NP   2 years ago Encounter for annual physical exam   [facility] Galen Manila, NP       Future Appointments             In 1 month Baity, Salvadore Oxford, NP [facility], [facility]

## 2021-05-25 MED ORDER — METHYLPHENIDATE HCL ER (OSM) 27 MG PO TBCR
27.0000 mg | EXTENDED_RELEASE_TABLET | Freq: Every day | ORAL | 0 refills | Status: DC
Start: 2021-05-25 — End: 2021-06-20

## 2021-06-18 ENCOUNTER — Other Ambulatory Visit: Payer: Self-pay | Admitting: Internal Medicine

## 2021-06-18 DIAGNOSIS — F902 Attention-deficit hyperactivity disorder, combined type: Secondary | ICD-10-CM

## 2021-06-18 NOTE — Telephone Encounter (Signed)
Requested medication (s) are due for refill today: yes  Requested medication (s) are on the active medication list: yes  Last refill:  05/25/21 #30  Future visit scheduled: yes  Notes to clinic:  med not delegated to NT to RF; no UDS in past 360 days   Requested Prescriptions  Pending Prescriptions Disp Refills   CONCERTA 27 MG CR tablet [Pharmacy Med Name: Concerta 27 mg tablet,extended release] 30 tablet 0    Sig: TAKE ONE TABLET BY MOUTH ONCE DAILY     Not Delegated - Psychiatry:  Stimulants/ADHD Failed - 06/18/2021 10:02 AM      Failed - This refill cannot be delegated      Failed - Urine Drug Screen completed in last 360 days      Failed - Valid encounter within last 3 months    Recent Outpatient Visits           5 months ago Primary hypertension   [facility] [place], Salvadore Oxford, NP   1 year ago Attention deficit hyperactivity disorder (ADHD), combined type   [facility], Jodelle Gross, FNP   1 year ago Essential hypertension   [facility], Jodelle Gross, FNP   2 years ago Essential hypertension   [facility] [place] Rudd, Alison Stalling, NP   2 years ago Encounter for annual physical exam   [facility] Kyung Rudd, Alison Stalling, NP       Future Appointments             In 2 weeks Sampson Si, Salvadore Oxford, NP [facility], [facility]

## 2021-06-20 ENCOUNTER — Other Ambulatory Visit: Payer: Self-pay

## 2021-06-20 DIAGNOSIS — F902 Attention-deficit hyperactivity disorder, combined type: Secondary | ICD-10-CM

## 2021-06-20 MED ORDER — METHYLPHENIDATE HCL ER (OSM) 27 MG PO TBCR
27.0000 mg | EXTENDED_RELEASE_TABLET | Freq: Every day | ORAL | 0 refills | Status: DC
Start: 2021-06-20 — End: 2021-07-23

## 2021-06-20 NOTE — Telephone Encounter (Signed)
Copied from CRM [id number]. Topic: General - Other >> Jun 20, 2021 10:00 AM Jaquita Rector A wrote: Reason for CRM: Patient mom called in regarding Rx to pharmacy per snap shot say that Rx failed transmission to pharmacy

## 2021-07-03 ENCOUNTER — Ambulatory Visit (INDEPENDENT_AMBULATORY_CARE_PROVIDER_SITE_OTHER): Payer: Medicaid Other | Admitting: Internal Medicine

## 2021-07-03 ENCOUNTER — Encounter: Payer: Self-pay | Admitting: Internal Medicine

## 2021-07-03 ENCOUNTER — Other Ambulatory Visit: Payer: Self-pay

## 2021-07-03 VITALS — BP 118/74 | HR 90 | Temp 98.7°F | Resp 17 | Ht 69.0 in | Wt 173.2 lb

## 2021-07-03 DIAGNOSIS — Z0001 Encounter for general adult medical examination with abnormal findings: Secondary | ICD-10-CM

## 2021-07-03 DIAGNOSIS — E663 Overweight: Secondary | ICD-10-CM | POA: Diagnosis not present

## 2021-07-03 DIAGNOSIS — Z6825 Body mass index (BMI) 25.0-25.9, adult: Secondary | ICD-10-CM | POA: Diagnosis not present

## 2021-07-03 DIAGNOSIS — Z23 Encounter for immunization: Secondary | ICD-10-CM | POA: Diagnosis not present

## 2021-07-03 DIAGNOSIS — G4700 Insomnia, unspecified: Secondary | ICD-10-CM | POA: Insufficient documentation

## 2021-07-03 DIAGNOSIS — G4701 Insomnia due to medical condition: Secondary | ICD-10-CM

## 2021-07-03 MED ORDER — TRAZODONE HCL 50 MG PO TABS: 25.0000 mg | ORAL_TABLET | Freq: Every evening | ORAL | 0 refills | Status: DC | PRN

## 2021-07-03 NOTE — Assessment & Plan Note (Signed)
We will trial Trazodone 

## 2021-07-03 NOTE — Progress Notes (Signed)
Subjective:    Patient ID: Ian Lam, male    DOB: 04-02-1986, 35 y.o.   MRN: [id number]  HPI  Patient presents the clinic today for his annual exam.  His mom also reports that he has difficulty falling asleep at times.  He is able to stay asleep.  He does nap sometimes during the day.  They have tried Melatonin with minimal relief of symptoms.  Flu: 05/2020 Tetanus: 09/2018 COVID: [place] Dibbles x1 Dentist: Annually  Diet: He does eat meat.  He consumes fruits and vegetables.  He does eat some fried foods.  He drinks mostly water, juice and soda. Exercise: None  Review of Systems     Past Medical History:  Diagnosis Date   ADHD    Allergy    Fragile X syndrome in male    Hypertension     Current Outpatient Medications  Medication Sig Dispense Refill   lisinopril (ZESTRIL) 20 MG tablet Take 1 tablet (20 mg total) by mouth daily. 90 tablet 3   loratadine (CLARITIN) 10 MG tablet TAKE ONE TABLET BY MOUTH ONCE DAILY 90 tablet 0   methylphenidate (CONCERTA) 27 MG PO CR tablet Take 1 tablet (27 mg total) by mouth daily. 30 tablet 0   No current facility-administered medications for this visit.    No Known Allergies  Family History  Problem Relation Age of Onset   Fragile X syndrome Brother     Social History   Socioeconomic History   Marital status: Single    Spouse name: Not on file   Number of children: Not on file   Years of education: 12   Highest education level: Not on file  Occupational History   Not on file  Tobacco Use   Smoking status: Never   Smokeless tobacco: Never  Vaping Use   Vaping Use: Never used  Substance and Sexual Activity   Alcohol use: Never   Drug use: Never   Sexual activity: Not Currently  Other Topics Concern   Not on file  Social History Narrative   Not on file   Social Determinants of Health   Financial Resource Strain: Not on file  Food Insecurity: Not on file  Transportation Needs: Not on file  Physical Activity: Not  on file  Stress: Not on file  Social Connections: Not on file  Intimate Partner Violence: Not on file     Constitutional: Denies fever, malaise, fatigue, headache or abrupt weight changes.  HEENT: Denies eye pain, eye redness, ear pain, ringing in the ears, wax buildup, runny nose, nasal congestion, bloody nose, or sore throat. Respiratory: Denies difficulty breathing, shortness of breath, cough or sputum production.   Cardiovascular: Denies chest pain, chest tightness, palpitations or swelling in the hands or feet.  Gastrointestinal: Denies abdominal pain, bloating, constipation, diarrhea or blood in the stool.  GU: Denies urgency, frequency, pain with urination, burning sensation, blood in urine, odor or discharge. Musculoskeletal: Denies decrease in range of motion, difficulty with gait, muscle pain or joint pain and swelling.  Skin: Denies redness, rashes, lesions or ulcercations.  Neurological: Patient reports inattention, insomnia.  Denies dizziness, difficulty with memory, difficulty with speech or problems with balance and coordination.  Psych: Denies anxiety, depression, SI/HI.  No other specific complaints in a complete review of systems (except as listed in HPI above).  Objective:   Physical Exam   BP 118/74 (BP Location: Right Arm, Patient Position: Sitting, Cuff Size: Normal)   Pulse 90   Temp 98.7 F (37.1  C) (Temporal)   Resp 17   Ht 5\' 9"  (1.753 m)   Wt 173 lb 3.2 oz (78.6 kg)   SpO2 100%   BMI 25.58 kg/m   Wt Readings from Last 3 Encounters:  12/26/20 168 lb 9.6 oz (76.5 kg)  06/02/20 160 lb 12.8 oz (72.9 kg)  11/17/19 160 lb 12.8 oz (72.9 kg)    General: Appears his stated age, overweight, in NAD. Skin: Warm, dry and intact.  HEENT: Head: normal shape and size; Eyes: sclera white and EOMs intact;  Neck:  Neck supple, trachea midline. No masses, lumps or thyromegaly present.  Cardiovascular: Normal rate and rhythm. S1,S2 noted.  No murmur, rubs or gallops  noted. No JVD or BLE edema.  Pulmonary/Chest: Normal effort and positive vesicular breath sounds. No respiratory distress. No wheezes, rales or ronchi noted.  Abdomen: Soft and nontender. Normal bowel sounds. No distention or masses noted. Liver, spleen and kidneys non palpable. Musculoskeletal: No difficulty with gait.  Neurological: Alert and oriented. Psychiatric: Mood and affect normal. Behavior is normal. Judgment and thought content normal.    BMET    Component Value Date/Time   NA 140 06/02/2020 0954   K 4.0 06/02/2020 0954   CL 104 06/02/2020 0954   CO2 27 06/02/2020 0954   GLUCOSE 98 06/02/2020 0954   BUN 24 06/02/2020 0954   CREATININE 1.29 06/02/2020 0954   CALCIUM 9.6 06/02/2020 0954   GFRNONAA 72 06/02/2020 0954   GFRAA 83 06/02/2020 0954    Lipid Panel     Component Value Date/Time   CHOL 142 06/02/2020 0954   TRIG 38 06/02/2020 0954   HDL 43 06/02/2020 0954   CHOLHDL 3.3 06/02/2020 0954   LDLCALC 88 06/02/2020 0954    CBC    Component Value Date/Time   WBC 6.2 06/02/2020 0954   RBC 5.05 06/02/2020 0954   HGB 15.0 06/02/2020 0954   HCT 43.6 06/02/2020 0954   PLT 195 06/02/2020 0954   MCV 86.3 06/02/2020 0954   MCH 29.7 06/02/2020 0954   MCHC 34.4 06/02/2020 0954   RDW 12.2 06/02/2020 0954   LYMPHSABS 1,717 06/02/2020 0954   EOSABS 19 06/02/2020 0954   BASOSABS 31 06/02/2020 0954    Hgb A1C No results found for: HGBA1C        Assessment & Plan:   Preventative Health Maintenance:  Flu shot today Tetanus UTD Encouraged him to get a COVID booster Encouraged him to consume a balanced diet and exercise regimen Advised him to see a dentist annually No labs indicated at this time  RTC in 6 months, follow-up chronic conditions  08/02/2020, NP This visit occurred during the SARS-CoV-2 public health emergency.  Safety protocols were in place, including screening questions prior to the visit, additional usage of staff PPE, and extensive  cleaning of exam room while observing appropriate contact time as indicated for disinfecting solutions.

## 2021-07-03 NOTE — Assessment & Plan Note (Signed)
Encouraged diet and exercise for weight loss ?

## 2021-07-03 NOTE — Patient Instructions (Signed)

## 2021-07-18 ENCOUNTER — Other Ambulatory Visit: Payer: Self-pay | Admitting: Internal Medicine

## 2021-07-18 DIAGNOSIS — F902 Attention-deficit hyperactivity disorder, combined type: Secondary | ICD-10-CM

## 2021-07-18 DIAGNOSIS — I10 Essential (primary) hypertension: Secondary | ICD-10-CM

## 2021-07-18 NOTE — Telephone Encounter (Signed)
Medication Refill - Medication: CONCERTA 54 MG CR tablet and blood pressure medication (completely out of BP medication) Caller is patient mother and she does not know the name of BP medication.   Has the patient contacted their pharmacy? Yes.    (Agent: If yes, when and what did the pharmacy advise?) Contact PCP and ask if PCP can send in a approval to refill Concerta early and send in a refill for BP medication  Preferred Pharmacy (with phone number or street name):  [facility] - [place], [place] - [address] Phone:  [phone]  Fax:  [phone]      Has the patient been seen for an appointment in the last year OR does the patient have an upcoming appointment? Yes.    Agent: Please be advised that RX refills may take up to 3 business days. We ask that you follow-up with your pharmacy.

## 2021-07-18 NOTE — Telephone Encounter (Signed)
Requested medication (s) are due for refill today: Yes  Requested medication (s) are on the active medication list: Yes  Last refill:  11/17/20 Lisinopril; 06/23/21 Concerta  Future visit scheduled: Yes  Notes to clinic:  Unable to refill per protocol, cannot delegate, failed labs.     Requested Prescriptions  Pending Prescriptions Disp Refills   lisinopril (ZESTRIL) 20 MG tablet 90 tablet 3    Sig: Take 1 tablet (20 mg total) by mouth daily.     Cardiovascular:  ACE Inhibitors Failed - 07/18/2021  1:28 PM      Failed - Cr in normal range and within 180 days    Creat  Date Value Ref Range Status  06/02/2020 1.29 0.60 - 1.35 mg/dL Final          Failed - K in normal range and within 180 days    Potassium  Date Value Ref Range Status  06/02/2020 4.0 3.5 - 5.3 mmol/L Final          Passed - Patient is not pregnant      Passed - Last BP in normal range    BP Readings from Last 1 Encounters:  07/03/21 118/74          Passed - Valid encounter within last 6 months    Recent Outpatient Visits           2 weeks ago Encounter for general adult medical examination with abnormal findings   [facility] [place], Salvadore Oxford, NP   6 months ago Primary hypertension   [facility] [place], Salvadore Oxford, NP   1 year ago Attention deficit hyperactivity disorder (ADHD), combined type   [facility], Jodelle Gross, FNP   1 year ago Essential hypertension   [facility], Jodelle Gross, FNP   2 years ago Essential hypertension   [facility] Galen Manila, NP       Future Appointments             In 5 months Baity, Salvadore Oxford, NP [facility], [facility]             methylphenidate (CONCERTA) 27 MG PO CR tablet 30 tablet 0    Sig: Take 1 tablet (27 mg total) by mouth daily.     Not Delegated - Psychiatry:  Stimulants/ADHD Failed - 07/18/2021  1:28 PM      Failed - This refill cannot be  delegated      Failed - Urine Drug Screen completed in last 360 days      Passed - Valid encounter within last 3 months    Recent Outpatient Visits           2 weeks ago Encounter for general adult medical examination with abnormal findings   [facility] [place], Salvadore Oxford, NP   6 months ago Primary hypertension   [facility] [place], Salvadore Oxford, NP   1 year ago Attention deficit hyperactivity disorder (ADHD), combined type   [facility], Jodelle Gross, FNP   1 year ago Essential hypertension   [facility], Jodelle Gross, FNP   2 years ago Essential hypertension   [facility] Galen Manila, NP       Future Appointments             In 5 months Baity, Salvadore Oxford, NP [facility], [facility]

## 2021-07-18 NOTE — Telephone Encounter (Signed)
Requested medication (s) are due for refill today Yes   Last ordered 06/20/21  Requested medication (s) are on the active medication list Yes  Future visit scheduled  Yes for 12/31/21  Note to clinic-Non delegated medication, due for urine drug screen. Routing for provider review.   Requested Prescriptions  Pending Prescriptions Disp Refills   CONCERTA 27 MG CR tablet [Pharmacy Med Name: Concerta 27 mg tablet,extended release] 30 tablet 0    Sig: TAKE ONE TABLET BY MOUTH ONCE DAILY     Not Delegated - Psychiatry:  Stimulants/ADHD Failed - 07/18/2021 10:34 AM      Failed - This refill cannot be delegated      Failed - Urine Drug Screen completed in last 360 days      Passed - Valid encounter within last 3 months    Recent Outpatient Visits           2 weeks ago Encounter for general adult medical examination with abnormal findings   [facility] [place], Salvadore Oxford, NP   6 months ago Primary hypertension   [facility] [place], Salvadore Oxford, NP   1 year ago Attention deficit hyperactivity disorder (ADHD), combined type   [facility], Jodelle Gross, FNP   1 year ago Essential hypertension   [facility], Jodelle Gross, FNP   2 years ago Essential hypertension   [facility] Galen Manila, NP       Future Appointments             In 5 months Baity, Salvadore Oxford, NP [facility], [facility]

## 2021-07-21 ENCOUNTER — Other Ambulatory Visit: Payer: Self-pay | Admitting: Internal Medicine

## 2021-07-21 DIAGNOSIS — J302 Other seasonal allergic rhinitis: Secondary | ICD-10-CM

## 2021-07-22 NOTE — Telephone Encounter (Signed)
Requested Prescriptions  Pending Prescriptions Disp Refills  . loratadine (CLARITIN) 10 MG tablet [Pharmacy Med Name: loratadine 10 mg tablet] 90 tablet 1    Sig: TAKE ONE TABLET BY MOUTH ONCE DAILY     Ear, Nose, and Throat:  Antihistamines Passed - 07/21/2021 11:50 AM      Passed - Valid encounter within last 12 months    Recent Outpatient Visits          2 weeks ago Encounter for general adult medical examination with abnormal findings   [facility], Salvadore Oxford, NP   6 months ago Primary hypertension   [facility], Salvadore Oxford, NP   1 year ago Attention deficit hyperactivity disorder (ADHD), combined type   [facility], Jodelle Gross, FNP   1 year ago Essential hypertension   [facility], Jodelle Gross, FNP   2 years ago Essential hypertension   [facility] Galen Manila, NP      Future Appointments            In 5 months Baity, Salvadore Oxford, NP [facility], [facility]

## 2021-07-23 MED ORDER — LISINOPRIL 20 MG PO TABS: 20.0000 mg | ORAL_TABLET | Freq: Every day | ORAL | 3 refills | Status: DC

## 2021-07-23 NOTE — Telephone Encounter (Signed)
Pt mother wants a fu call as why the med Concerta is not being refillled states was told no labs till the first of the year, FU [phone]

## 2021-08-21 ENCOUNTER — Other Ambulatory Visit: Payer: Self-pay | Admitting: Internal Medicine

## 2021-08-21 DIAGNOSIS — F902 Attention-deficit hyperactivity disorder, combined type: Secondary | ICD-10-CM

## 2021-08-24 ENCOUNTER — Telehealth: Payer: Self-pay | Admitting: Internal Medicine

## 2021-08-24 NOTE — Telephone Encounter (Signed)
Copied from CRM [id number]. Topic: Quick Communication - Rx Refill/Question >> Aug 24, 2021 10:39 AM Gaetana Michaelis A wrote: Medication: CONCERTA 27 MG CR tablet [[id number]]   Has the patient contacted their pharmacy? Yes.  The patient's mother has been directed to contact their PCP (Agent: If no, request that the patient contact the pharmacy for the refill. If patient does not wish to contact the pharmacy document the reason why and proceed with request.) (Agent: If yes, when and what did the pharmacy advise?)  Preferred Pharmacy (with phone number or street name): [facility] - [place], [place] - [address]  Phone:  [phone] Fax:  [phone]    Has the patient been seen for an appointment in the last year OR does the patient have an upcoming appointment? Yes.    Agent: Please be advised that RX refills may take up to 3 business days. We ask that you follow-up with your pharmacy.

## 2021-08-24 NOTE — Telephone Encounter (Signed)
I spoke to the pharmacist and after trying to resubmit the patient prescription again it was approved.

## 2021-08-24 NOTE — Telephone Encounter (Signed)
Pharmacy advised Concerta needs a PA due to going from medicaid to medicare / please advise / pts mom states he should still have medicaid

## 2021-09-20 ENCOUNTER — Other Ambulatory Visit: Payer: Self-pay | Admitting: Internal Medicine

## 2021-09-20 DIAGNOSIS — F902 Attention-deficit hyperactivity disorder, combined type: Secondary | ICD-10-CM

## 2021-09-20 NOTE — Telephone Encounter (Signed)
Requested medication (s) are due for refill today: Yes  Requested medication (s) are on the active medication list: Yes  Last refill:  08/21/21  Future visit scheduled: Yes  Notes to clinic:  See request.    Requested Prescriptions  Pending Prescriptions Disp Refills   CONCERTA 27 MG CR tablet [Pharmacy Med Name: Concerta 27 mg tablet,extended release] 30 tablet 0    Sig: TAKE ONE TABLET BY MOUTH ONCE DAILY     Not Delegated - Psychiatry:  Stimulants/ADHD Failed - 09/20/2021  8:58 AM      Failed - This refill cannot be delegated      Failed - Urine Drug Screen completed in last 360 days      Passed - Valid encounter within last 3 months    Recent Outpatient Visits           2 months ago Encounter for general adult medical examination with abnormal findings   [facility] [place], Coralie Keens, NP   8 months ago Primary hypertension   [facility] [place], Coralie Keens, NP   1 year ago Attention deficit hyperactivity disorder (ADHD), combined type   [place], FNP   1 year ago Essential hypertension   [place], FNP   2 years ago Essential hypertension   [facility] [employer], NP       Future Appointments             In 3 months Baity, Coralie Keens, NP [facility], [facility]

## 2021-10-19 ENCOUNTER — Other Ambulatory Visit: Payer: Self-pay | Admitting: Internal Medicine

## 2021-10-19 ENCOUNTER — Telehealth: Payer: Self-pay | Admitting: Internal Medicine

## 2021-10-19 DIAGNOSIS — F902 Attention-deficit hyperactivity disorder, combined type: Secondary | ICD-10-CM

## 2021-10-19 NOTE — Telephone Encounter (Signed)
Medication: CONCERTA 27 MG CR tablet [[id number]]   Has the patient contacted their pharmacy? YES advised to contact the office (Agent: If no, request that the patient contact the pharmacy for the refill. If patient does not wish to contact the pharmacy document the reason why and proceed with request.) (Agent: If yes, when and what did the pharmacy advise?)  Preferred Pharmacy (with phone number or street name): [facility] [address] [place], [place]. [address] [phone] Has the patient been seen for an appointment in the last year OR does the patient have an upcoming appointment? YES   Agent: Please be advised that RX refills may take up to 3 business days. We ask that you follow-up with your pharmacy.

## 2021-10-19 NOTE — Telephone Encounter (Signed)
Requested medication (s) are due for refill today:   Provider to review  Requested medication (s) are on the active medication list:   Yes  Future visit scheduled:   Yes   Last ordered: 09/20/2021 #30, 0 refills  Returned because it's a non delegated refill.   Requested Prescriptions  Pending Prescriptions Disp Refills   methylphenidate (CONCERTA) 27 MG PO CR tablet 30 tablet 0    Sig: Take 1 tablet (27 mg total) by mouth daily.     Not Delegated - Psychiatry:  Stimulants/ADHD Failed - 10/19/2021 12:40 PM      Failed - This refill cannot be delegated      Failed - Urine Drug Screen completed in last 360 days      Passed - Last BP in normal range    BP Readings from Last 1 Encounters:  07/03/21 118/74          Passed - Last Heart Rate in normal range    Pulse Readings from Last 1 Encounters:  07/03/21 90          Passed - Valid encounter within last 6 months    Recent Outpatient Visits           3 months ago Encounter for general adult medical examination with abnormal findings   [facility] [place], Salvadore Oxford, NP   9 months ago Primary hypertension   [facility] [place], Salvadore Oxford, NP   1 year ago Attention deficit hyperactivity disorder (ADHD), combined type   [place], Jodelle Gross, FNP   1 year ago Essential hypertension   [facility], Jodelle Gross, FNP   2 years ago Essential hypertension   [facility] Galen Manila, NP       Future Appointments             In 2 months Baity, Salvadore Oxford, NP [facility], [facility]

## 2021-10-19 NOTE — Telephone Encounter (Signed)
Requested Prescriptions  Pending Prescriptions Disp Refills   traZODone (DESYREL) 50 MG tablet [Pharmacy Med Name: TRAZODONE 50MG  TABLETS] 30 tablet 0    Sig: TAKE 1/2 TABLET BY MOUTH AT BEDTIME AS NEEDED FOR SLEEP     Psychiatry: Antidepressants - Serotonin Modulator Passed - 10/19/2021 12:11 PM      Passed - Valid encounter within last 6 months    Recent Outpatient Visits          3 months ago Encounter for general adult medical examination with abnormal findings   [facility] [place], [place], NP   9 months ago Primary hypertension   [facility] [place], Mullins, NP   1 year ago Attention deficit hyperactivity disorder (ADHD), combined type   [facility], FNP   1 year ago Essential hypertension   [facility], [facility], FNP   2 years ago Essential hypertension   [facility], NP      Future Appointments            In 2 months Baity, Galen Manila, NP [facility], [facility]

## 2021-10-23 MED ORDER — METHYLPHENIDATE HCL ER (OSM) 27 MG PO TBCR: 27.0000 mg | EXTENDED_RELEASE_TABLET | Freq: Every day | ORAL | 0 refills | Status: DC

## 2021-10-25 NOTE — Telephone Encounter (Signed)
This was sent to the pharmacy 4 days ago ?

## 2021-10-25 NOTE — Telephone Encounter (Signed)
Pt's mom called saying pt needs his Concerta sent to the pharmacy   he took his last one this morning ? ?[facility]  ?

## 2021-10-26 ENCOUNTER — Telehealth: Payer: Self-pay

## 2021-10-26 DIAGNOSIS — F902 Attention-deficit hyperactivity disorder, combined type: Secondary | ICD-10-CM

## 2021-10-26 NOTE — Telephone Encounter (Signed)
Spoke with the pharmacist at [facility].  She verified that they do have the prescription. ? ? ?Thanks,  ? ?-Mickel Baas  ?

## 2021-10-26 NOTE — Telephone Encounter (Signed)
I tried calling [facility] [address] and was not able to get connected with a staff member after being on hold for over 20 Minutes.  I will have to try again on Monday.   ? ? ?Thanks,  ? ?-Ian Lam  ?

## 2021-10-26 NOTE — Telephone Encounter (Signed)
What was sent in was generic, not brand-name ?

## 2021-10-26 NOTE — Telephone Encounter (Signed)
Copied from CRM [id number]. Topic: General - Other ?>> Oct 26, 2021 11:37 AM Herby Abraham C wrote: ?Reason for CRM: pt's mother Silvio Pate called in to update provider, she was told by her pharmacy that pt's insurance no longer cover the brand name medication CONCERTA. She says that she was told that pt's Rx need to be changed to generic. Mom says that pt is currently completely out of his medication.  ? ?Please assist further. ?

## 2021-10-30 ENCOUNTER — Other Ambulatory Visit: Payer: Self-pay | Admitting: Internal Medicine

## 2021-10-30 DIAGNOSIS — I10 Essential (primary) hypertension: Secondary | ICD-10-CM

## 2021-10-30 MED ORDER — CONCERTA 27 MG PO TBCR: 27.0000 mg | EXTENDED_RELEASE_TABLET | Freq: Every day | ORAL | 0 refills | Status: DC

## 2021-10-30 NOTE — Telephone Encounter (Signed)
Brand name sent to pharmacy

## 2021-10-30 NOTE — Telephone Encounter (Signed)
I spoke with Lurena Joiner from [facility].  She states they cannot get the generic methylphenidate but can only get name brand.  Lurena Joiner states the prescription needs to be resent as name brand and DAW so the insurance will cover it.  ? ? ?

## 2021-10-30 NOTE — Addendum Note (Signed)
Addended by: Lorre Munroe on: 10/30/2021 03:29 PM ? ? Modules accepted: Orders ? ?

## 2021-10-30 NOTE — Telephone Encounter (Signed)
Pt mother, Silvio Pate calling again stating pt out of med and wants dr to call [facility]  again, told her that Dr having trouble reaching ?

## 2021-10-31 MED ORDER — LISINOPRIL 20 MG PO TABS: 20.0000 mg | ORAL_TABLET | Freq: Every day | ORAL | 2 refills | Status: DC

## 2021-10-31 NOTE — Addendum Note (Signed)
Addended by: Ross Ludwig on: 10/31/2021 01:58 PM ? ? Modules accepted: Orders ? ?

## 2021-10-31 NOTE — Telephone Encounter (Signed)
Pts mom called about refill denial . She stated that the pt was using [facility] and they were suppose to transfer Rx to [facility] [place] but [facility] dont [facility] any [facility] on [facility] / please advise about new RX for Lisinopril  ?

## 2021-10-31 NOTE — Telephone Encounter (Signed)
Refilled 07/13/2021 #90 3 refills - 1 year supply. ?Requested Prescriptions  ?Pending Prescriptions Disp Refills  ?? lisinopril (ZESTRIL) 20 MG tablet [Pharmacy Med Name: LISINOPRIL 20MG  TABLETS] 90 tablet 3  ?  Sig: TAKE 1 TABLET BY MOUTH DAILY  ?  ? Cardiovascular:  ACE Inhibitors Failed - 10/30/2021  6:05 PM  ?  ?  Failed - Cr in normal range and within 180 days  ?  Creat  ?Date Value Ref Range Status  ?06/02/2020 1.29 0.60 - 1.35 mg/dL Final  ?   ?  ?  Failed - K in normal range and within 180 days  ?  Potassium  ?Date Value Ref Range Status  ?06/02/2020 4.0 3.5 - 5.3 mmol/L Final  ?   ?  ?  Passed - Patient is not pregnant  ?  ?  Passed - Last BP in normal range  ?  BP Readings from Last 1 Encounters:  ?07/03/21 118/74  ?   ?  ?  Passed - Valid encounter within last 6 months  ?  Recent Outpatient Visits   ?      ? 4 months ago Encounter for general adult medical examination with abnormal findings  ? [facility] [place], [place] W, NP  ? 10 months ago Primary hypertension  ? [facility] [place], [place] W, NP  ? 1 year ago Attention deficit hyperactivity disorder (ADHD), combined type  ? [place], FNP  ? 1 year ago Essential hypertension  ? [place], FNP  ? 2 years ago Essential hypertension  ? [facility] Merrily[facility] Puma, Jerrel Ivory, NP  ?  ?  ?Future Appointments   ?        ? In 2 months Baity, Coralie Keens, NP [facility], [place]  ?  ? ?  ?  ?  ? ?

## 2021-10-31 NOTE — Telephone Encounter (Signed)
Sending to different pharmacy d/t previous one rx was sent to closed. ? ?Requested Prescriptions  ?Pending Prescriptions Disp Refills  ?? lisinopril (ZESTRIL) 20 MG tablet 90 tablet 3  ?  Sig: Take 1 tablet (20 mg total) by mouth daily.  ?  ? Cardiovascular:  ACE Inhibitors Failed - 10/31/2021  1:58 PM  ?  ?  Failed - Cr in normal range and within 180 days  ?  Creat  ?Date Value Ref Range Status  ?06/02/2020 1.29 0.60 - 1.35 mg/dL Final  ?   ?  ?  Failed - K in normal range and within 180 days  ?  Potassium  ?Date Value Ref Range Status  ?06/02/2020 4.0 3.5 - 5.3 mmol/L Final  ?   ?  ?  Passed - Patient is not pregnant  ?  ?  Passed - Last BP in normal range  ?  BP Readings from Last 1 Encounters:  ?07/03/21 118/74  ?   ?  ?  Passed - Valid encounter within last 6 months  ?  Recent Outpatient Visits   ?      ? 4 months ago Encounter for general adult medical examination with abnormal findings  ? [facility] [place], [place] W, NP  ? 10 months ago Primary hypertension  ? [facility] [place], [place] W, NP  ? 1 year ago Attention deficit hyperactivity disorder (ADHD), combined type  ? [facility], Jodelle Gross, FNP  ? 1 year ago Essential hypertension  ? [facility], Jodelle Gross, FNP  ? 2 years ago Essential hypertension  ? [facility], Alison Stalling, NP  ?  ?  ?Future Appointments   ?        ? In 2 months Baity, Salvadore Oxford, NP [facility]  ?  ? ?  ?  ?  ?Refused Prescriptions Disp Refills  ?? lisinopril (ZESTRIL) 20 MG tablet [Pharmacy Med Name: LISINOPRIL 20MG  TABLETS] 90 tablet 3  ?  Sig: TAKE 1 TABLET BY MOUTH DAILY  ?  ? Cardiovascular:  ACE Inhibitors Failed - 10/31/2021  1:58 PM  ?  ?  Failed - Cr in normal range and within 180 days  ?  Creat  ?Date Value Ref Range Status  ?06/02/2020 1.29 0.60 - 1.35 mg/dL Final  ?   ?  ?  Failed - K in normal range and within 180 days  ?  Potassium  ?Date Value Ref Range  Status  ?06/02/2020 4.0 3.5 - 5.3 mmol/L Final  ?   ?  ?  Passed - Patient is not pregnant  ?  ?  Passed - Last BP in normal range  ?  BP Readings from Last 1 Encounters:  ?07/03/21 118/74  ?   ?  ?  Passed - Valid encounter within last 6 months  ?  Recent Outpatient Visits   ?      ? 4 months ago Encounter for general adult medical examination with abnormal findings  ? [facility] [place], [place], NP  ? 10 months ago Primary hypertension  ? [facility] [place], [place], NP  ? 1 year ago Attention deficit hyperactivity disorder (ADHD), combined type  ? [facility], FNP  ? 1 year ago Essential hypertension  ? [facility], [facility], FNP  ? 2 years ago Essential hypertension  ?  [facility] Ky[facility] Rudd, Alison Stalling, NP  ?  ?  ?Future Appointments   ?        ? In 2 months Baity, Salvadore Oxford, NP [facility]  ?  ? ?  ?  ?  ? ? ?

## 2021-11-22 ENCOUNTER — Other Ambulatory Visit: Payer: Self-pay | Admitting: Internal Medicine

## 2021-11-22 DIAGNOSIS — F902 Attention-deficit hyperactivity disorder, combined type: Secondary | ICD-10-CM

## 2021-11-22 NOTE — Telephone Encounter (Signed)
Copied from CRM [id number]. Topic: Quick Communication - Rx Refill/Question ?>> Nov 22, 2021 11:53 AM Marylen Ponto wrote: ?Medication: CONCERTA 27 MG CR tablet ? ?Has the patient contacted their pharmacy? Yes.  Told to contact provider ?(Agent: If no, request that the patient contact the pharmacy for the refill. If patient does not wish to contact the pharmacy document the reason why and proceed with request.) ?(Agent: If yes, when and what did the pharmacy advise?) ? ?Preferred Pharmacy (with phone number or street name): [id number] [place], [place] - [address] AT [facility]  ?Phone:  [phone] ?Fax:  [phone] ? ?Has the patient been seen for an appointment in the last year OR does the patient have an upcoming appointment? Yes.   ? ?Agent: Please be advised that RX refills may take up to 3 business days. We ask that you follow-up with your pharmacy. ?

## 2021-11-23 MED ORDER — CONCERTA 27 MG PO TBCR: 27.0000 mg | EXTENDED_RELEASE_TABLET | Freq: Every day | ORAL | 0 refills | Status: DC

## 2021-11-23 NOTE — Telephone Encounter (Signed)
Requested medication (s) are due for refill today - yes ? ?Requested medication (s) are on the active medication list -yes ? ?Future visit scheduled -yes ? ?Last refill: 10/30/21 #30 ? ?Notes to clinic: Request RF: non delegated Rx ? ?Requested Prescriptions  ?Pending Prescriptions Disp Refills  ? CONCERTA 27 MG CR tablet 30 tablet 0  ?  Sig: Take 1 tablet (27 mg total) by mouth daily.  ?  ? Not Delegated - Psychiatry:  Stimulants/ADHD Failed - 11/22/2021  1:47 PM  ?  ?  Failed - This refill cannot be delegated  ?  ?  Failed - Urine Drug Screen completed in last 360 days  ?  ?  Passed - Last BP in normal range  ?  BP Readings from Last 1 Encounters:  ?07/03/21 118/74  ?  ?  ?  ?  Passed - Last Heart Rate in normal range  ?  Pulse Readings from Last 1 Encounters:  ?07/03/21 90  ?  ?  ?  ?  Passed - Valid encounter within last 6 months  ?  Recent Outpatient Visits   ? ?      ? 4 months ago Encounter for general adult medical examination with abnormal findings  ? [facility] [place], Salvadore Oxford, NP  ? 11 months ago Primary hypertension  ? [facility] [place], [place] W, NP  ? 1 year ago Attention deficit hyperactivity disorder (ADHD), combined type  ? [facility], Jodelle Gross, FNP  ? 2 years ago Essential hypertension  ? [facility], Jodelle Gross, FNP  ? 2 years ago Essential hypertension  ? [facility] Rudd, Alison Stalling, NP  ? ?  ?  ?Future Appointments   ? ?        ? In 1 week Baity, Salvadore Oxford, NP [facility]  ? ?  ? ?  ?  ?  ? ? ? ?Requested Prescriptions  ?Pending Prescriptions Disp Refills  ? CONCERTA 27 MG CR tablet 30 tablet 0  ?  Sig: Take 1 tablet (27 mg total) by mouth daily.  ?  ? Not Delegated - Psychiatry:  Stimulants/ADHD Failed - 11/22/2021  1:47 PM  ?  ?  Failed - This refill cannot be delegated  ?  ?  Failed - Urine Drug Screen completed in last 360 days  ?  ?  Passed - Last BP in normal range  ?  BP  Readings from Last 1 Encounters:  ?07/03/21 118/74  ?  ?  ?  ?  Passed - Last Heart Rate in normal range  ?  Pulse Readings from Last 1 Encounters:  ?07/03/21 90  ?  ?  ?  ?  Passed - Valid encounter within last 6 months  ?  Recent Outpatient Visits   ? ?      ? 4 months ago Encounter for general adult medical examination with abnormal findings  ? [facility] [place], Salvadore Oxford, NP  ? 11 months ago Primary hypertension  ? [facility] [place], [place] W, NP  ? 1 year ago Attention deficit hyperactivity disorder (ADHD), combined type  ? [facility], Jodelle Gross, FNP  ? 2 years ago Essential hypertension  ? [facility], Jodelle Gross, FNP  ? 2 years ago Essential hypertension  ? [facility] Ky[facility] Rudd, Alison Stalling, NP  ? ?  ?  ?  Future Appointments   ? ?        ? In 1 week Baity, Salvadore Oxford, NP [facility], [facility]  ? ?  ? ?  ?  ?  ? ? ? ?

## 2021-11-27 NOTE — Telephone Encounter (Signed)
Pt mother stated the pharmacy advised her the medication was "ejected" when trying to fill.  ?I reached out to the pharmacy directly, but I was unable to speak with anyone at pharmacy. ? ?Pt mother is requesting a call back with a follow up.  ?

## 2021-11-27 NOTE — Addendum Note (Signed)
Addended by: Ashley Royalty E on: 11/27/2021 02:06 PM ? ? Modules accepted: Orders ? ?

## 2021-12-04 ENCOUNTER — Ambulatory Visit (INDEPENDENT_AMBULATORY_CARE_PROVIDER_SITE_OTHER): Payer: Medicaid Other | Admitting: Internal Medicine

## 2021-12-04 ENCOUNTER — Encounter: Payer: Self-pay | Admitting: Internal Medicine

## 2021-12-04 VITALS — BP 124/82 | HR 72 | Temp 97.7°F | Wt 170.0 lb

## 2021-12-04 DIAGNOSIS — E7800 Pure hypercholesterolemia, unspecified: Secondary | ICD-10-CM | POA: Diagnosis not present

## 2021-12-04 DIAGNOSIS — Z6825 Body mass index (BMI) 25.0-25.9, adult: Secondary | ICD-10-CM

## 2021-12-04 DIAGNOSIS — F902 Attention-deficit hyperactivity disorder, combined type: Secondary | ICD-10-CM

## 2021-12-04 DIAGNOSIS — Z833 Family history of diabetes mellitus: Secondary | ICD-10-CM

## 2021-12-04 DIAGNOSIS — Z1159 Encounter for screening for other viral diseases: Secondary | ICD-10-CM

## 2021-12-04 DIAGNOSIS — G4701 Insomnia due to medical condition: Secondary | ICD-10-CM

## 2021-12-04 DIAGNOSIS — I10 Essential (primary) hypertension: Secondary | ICD-10-CM

## 2021-12-04 DIAGNOSIS — E663 Overweight: Secondary | ICD-10-CM

## 2021-12-04 NOTE — Assessment & Plan Note (Signed)
Encourage diet and exercise for weight loss 

## 2021-12-04 NOTE — Assessment & Plan Note (Signed)
Continue Concerta 

## 2021-12-04 NOTE — Assessment & Plan Note (Signed)
C-Met and lipid profile today Encouraged him to consume a low-fat diet 

## 2021-12-04 NOTE — Progress Notes (Signed)
? ?Subjective:  ? ? Patient ID: Ian Lam, male    DOB: 1986-04-05, 36 y.o.   MRN: [id number] ? ?HPI ? ?Patient presents to clinic today for 79-month follow-up of chronic conditions. ? ?HTN: His BP today is 124/82.  He is taking Lisinopril as prescribed.  There is no ECG on file. ? ?ADHD: Managed on Concerta.  He is not following with psychiatry. ? ?HLD: His last LDL was 88, triglycerides 38, 05/2020.  He is not taking any cholesterol-lowering medication at this time.  He tries to consume a low-fat diet. ? ?Insomnia: He has difficulty falling asleep and staying asleep.  He is taking Trazodone as prescribed.  There is no sleep study on file. ? ?Review of Systems ? ?   ?Past Medical History:  ?Diagnosis Date  ? ADHD   ? Allergy   ? Fragile X syndrome in male   ? Hypertension   ? ? ?Current Outpatient Medications  ?Medication Sig Dispense Refill  ? CONCERTA 27 MG CR tablet Take 1 tablet (27 mg total) by mouth daily. 30 tablet 0  ? lisinopril (ZESTRIL) 20 MG tablet Take 1 tablet (20 mg total) by mouth daily. 90 tablet 2  ? loratadine (CLARITIN) 10 MG tablet TAKE ONE TABLET BY MOUTH ONCE DAILY 90 tablet 1  ? traZODone (DESYREL) 50 MG tablet TAKE 1/2 TABLET BY MOUTH AT BEDTIME AS NEEDED FOR SLEEP 30 tablet 0  ? ?No current facility-administered medications for this visit.  ? ? ?No Known Allergies ? ?Family History  ?Problem Relation Age of Onset  ? Fragile X syndrome Brother   ? ? ?Social History  ? ?Socioeconomic History  ? Marital status: Single  ?  Spouse name: Not on file  ? Number of children: Not on file  ? Years of education: 54  ? Highest education level: Not on file  ?Occupational History  ? Not on file  ?Tobacco Use  ? Smoking status: Never  ? Smokeless tobacco: Never  ?Vaping Use  ? Vaping Use: Never used  ?Substance and Sexual Activity  ? Alcohol use: Never  ? Drug use: Never  ? Sexual activity: Not Currently  ?Other Topics Concern  ? Not on file  ?Social History Narrative  ? Not on file  ? ?Social  Determinants of Health  ? ?Financial Resource Strain: Not on file  ?Food Insecurity: Not on file  ?Transportation Needs: Not on file  ?Physical Activity: Not on file  ?Stress: Not on file  ?Social Connections: Not on file  ?Intimate Partner Violence: Not on file  ? ? ? ?Constitutional: Denies fever, malaise, fatigue, headache or abrupt weight changes.  ?Respiratory: Denies difficulty breathing, shortness of breath, cough or sputum production.   ?Cardiovascular: Denies chest pain, chest tightness, palpitations or swelling in the hands or feet.  ?Musculoskeletal: Denies decrease in range of motion, difficulty with gait, muscle pain or joint pain and swelling.  ?Skin: Denies redness, rashes, lesions or ulcercations.  ?Neurological: Patient reports difficulty with memory, insomnia.  Denies dizziness, difficulty with speech or problems with balance and coordination.  ?Psych: Denies anxiety, depression, SI/HI. ? ?No other specific complaints in a complete review of systems (except as listed in HPI above). ? ?Objective:  ? Physical Exam ? ?BP 124/82 (BP Location: Left Arm, Patient Position: Sitting, Cuff Size: Large)   Pulse 72   Temp 97.7 ?F (36.5 ?C) (Temporal)   Wt 170 lb (77.1 kg)   SpO2 99%   BMI 25.10 kg/m?  ? ?Wt Readings  from Last 3 Encounters:  ?07/03/21 173 lb 3.2 oz (78.6 kg)  ?12/26/20 168 lb 9.6 oz (76.5 kg)  ?06/02/20 160 lb 12.8 oz (72.9 kg)  ? ? ?General: Appears his stated age, overweight, in NAD. ?Skin: Warm, dry and intact.  ?HEENT: Head: normal shape and size; Eyes: sclera white, PERRLA and EOMs intact;  ?Cardiovascular: Normal rate and rhythm. S1,S2 noted.  No murmur, rubs or gallops noted. No JVD or BLE edema.  ?Pulmonary/Chest: Normal effort and positive vesicular breath sounds. No respiratory distress. No wheezes, rales or ronchi noted.  ?Musculoskeletal:  No difficulty with gait.  ?Neurological: Alert and oriented.  ?Psychiatric: Mood and affect normal. Behavior is normal. Judgment and thought  content normal.  ? ? ?BMET ?   ?Component Value Date/Time  ? NA 140 06/02/2020 0954  ? K 4.0 06/02/2020 0954  ? CL 104 06/02/2020 0954  ? CO2 27 06/02/2020 0954  ? GLUCOSE 98 06/02/2020 0954  ? BUN 24 06/02/2020 0954  ? CREATININE 1.29 06/02/2020 0954  ? CALCIUM 9.6 06/02/2020 0954  ? GFRNONAA 72 06/02/2020 0954  ? GFRAA 83 06/02/2020 0954  ? ? ?Lipid Panel  ?   ?Component Value Date/Time  ? CHOL 142 06/02/2020 0954  ? TRIG 38 06/02/2020 0954  ? HDL 43 06/02/2020 0954  ? CHOLHDL 3.3 06/02/2020 0954  ? LDLCALC 88 06/02/2020 0954  ? ? ?CBC ?   ?Component Value Date/Time  ? WBC 6.2 06/02/2020 0954  ? RBC 5.05 06/02/2020 0954  ? HGB 15.0 06/02/2020 0954  ? HCT 43.6 06/02/2020 0954  ? PLT 195 06/02/2020 0954  ? MCV 86.3 06/02/2020 0954  ? MCH 29.7 06/02/2020 0954  ? MCHC 34.4 06/02/2020 0954  ? RDW 12.2 06/02/2020 0954  ? LYMPHSABS 1,717 06/02/2020 0954  ? EOSABS 19 06/02/2020 0954  ? BASOSABS 31 06/02/2020 0954  ? ? ?Hgb A1C ?No results found for: HGBA1C ? ? ? ? ? ? ?   ?Assessment & Plan:  ? ?RTC in 6 months for your annual exam ?Nicki Reaper, NP ? ?

## 2021-12-04 NOTE — Patient Instructions (Signed)
Heart-Healthy Eating Plan Heart-healthy meal planning includes: Eating less unhealthy fats. Eating more healthy fats. Making other changes in your diet. Talk with your doctor or a diet specialist (dietitian) to create an eating plan that is right for you. What is my plan? Your doctor may recommend an eating plan that includes: Total fat: ______% or less of total calories a day. Saturated fat: ______% or less of total calories a day. Cholesterol: less than _________mg a day. What are tips for following this plan? Cooking Avoid frying your food. Try to bake, boil, grill, or broil it instead. You can also reduce fat by: Removing the skin from poultry. Removing all visible fats from meats. Steaming vegetables in water or broth. Meal planning  At meals, divide your plate into four equal parts: Fill one-half of your plate with vegetables and green salads. Fill one-fourth of your plate with whole grains. Fill one-fourth of your plate with lean protein foods. Eat 4-5 servings of vegetables per day. A serving of vegetables is: 1 cup of raw or cooked vegetables. 2 cups of raw leafy greens. Eat 4-5 servings of fruit per day. A serving of fruit is: 1 medium whole fruit.  cup of dried fruit.  cup of fresh, frozen, or canned fruit.  cup of 100% fruit juice. Eat more foods that have soluble fiber. These are apples, broccoli, carrots, beans, peas, and barley. Try to get 20-30 g of fiber per day. Eat 4-5 servings of nuts, legumes, and seeds per week: 1 serving of dried beans or legumes equals  cup after being cooked. 1 serving of nuts is  cup. 1 serving of seeds equals 1 tablespoon. General information Eat more home-cooked food. Eat less restaurant, buffet, and fast food. Limit or avoid alcohol. Limit foods that are high in starch and sugar. Avoid fried foods. Lose weight if you are overweight. Keep track of how much salt (sodium) you eat. This is important if you have high blood  pressure. Ask your doctor to tell you more about this. Try to add vegetarian meals each week. Fats Choose healthy fats. These include olive oil and canola oil, flaxseeds, walnuts, almonds, and seeds. Eat more omega-3 fats. These include salmon, mackerel, sardines, tuna, flaxseed oil, and ground flaxseeds. Try to eat fish at least 2 times each week. Check food labels. Avoid foods with trans fats or high amounts of saturated fat. Limit saturated fats. These are often found in animal products, such as meats, butter, and cream. These are also found in plant foods, such as palm oil, palm kernel oil, and coconut oil. Avoid foods with partially hydrogenated oils in them. These have trans fats. Examples are stick margarine, some tub margarines, cookies, crackers, and other baked goods. What foods can I eat? Fruits All fresh, canned (in natural juice), or frozen fruits. Vegetables Fresh or frozen vegetables (raw, steamed, roasted, or grilled). Green salads. Grains Most grains. Choose whole wheat and whole grains most of the time. Rice and pasta, including brown rice and pastas made with whole wheat. Meats and other proteins Lean, well-trimmed beef, veal, pork, and lamb. Chicken and turkey without skin. All fish and shellfish. Wild duck, rabbit, pheasant, and venison. Egg whites or low-cholesterol egg substitutes. Dried beans, peas, lentils, and tofu. Seeds and most nuts. Dairy Low-fat or nonfat cheeses, including ricotta and mozzarella. Skim or 1% milk that is liquid, powdered, or evaporated. Buttermilk that is made with low-fat milk. Nonfat or low-fat yogurt. Fats and oils Non-hydrogenated (trans-free) margarines. Vegetable oils, including   soybean, sesame, sunflower, olive, peanut, safflower, corn, canola, and cottonseed. Salad dressings or mayonnaise made with a vegetable oil. Beverages Mineral water. Coffee and tea. Diet carbonated beverages. Sweets and desserts Sherbet, gelatin, and fruit ice.  Small amounts of dark chocolate. Limit all sweets and desserts. Seasonings and condiments All seasonings and condiments. The items listed above may not be a complete list of foods and drinks you can eat. Contact a dietitian for more options. What foods should I avoid? Fruits Canned fruit in heavy syrup. Fruit in cream or butter sauce. Fried fruit. Limit coconut. Vegetables Vegetables cooked in cheese, cream, or butter sauce. Fried vegetables. Grains Breads that are made with saturated or trans fats, oils, or whole milk. Croissants. Sweet rolls. Donuts. High-fat crackers, such as cheese crackers. Meats and other proteins Fatty meats, such as hot dogs, ribs, sausage, bacon, rib-eye roast or steak. High-fat deli meats, such as salami and bologna. Caviar. Domestic duck and goose. Organ meats, such as liver. Dairy Cream, sour cream, cream cheese, and creamed cottage cheese. Whole-milk cheeses. Whole or 2% milk that is liquid, evaporated, or condensed. Whole buttermilk. Cream sauce or high-fat cheese sauce. Yogurt that is made from whole milk. Fats and oils Meat fat, or shortening. Cocoa butter, hydrogenated oils, palm oil, coconut oil, palm kernel oil. Solid fats and shortenings, including bacon fat, salt pork, lard, and butter. Nondairy cream substitutes. Salad dressings with cheese or sour cream. Beverages Regular sodas and juice drinks with added sugar. Sweets and desserts Frosting. Pudding. Cookies. Cakes. Pies. Milk chocolate or white chocolate. Buttered syrups. Full-fat ice cream or ice cream drinks. The items listed above may not be a complete list of foods and drinks to avoid. Contact a dietitian for more information. Summary Heart-healthy meal planning includes eating less unhealthy fats, eating more healthy fats, and making other changes in your diet. Eat a balanced diet. This includes fruits and vegetables, low-fat or nonfat dairy, lean protein, nuts and legumes, whole grains, and  heart-healthy oils and fats. This information is not intended to replace advice given to you by your health care provider. Make sure you discuss any questions you have with your health care provider. Document Revised: 12/21/2020 Document Reviewed: 12/21/2020 Elsevier Patient Education  2022 Elsevier Inc.  

## 2021-12-04 NOTE — Assessment & Plan Note (Signed)
Controlled on Lisinopril ?Reinforced DASH diet and exercise for weight loss ?C-Met today ?

## 2021-12-04 NOTE — Assessment & Plan Note (Signed)
Doing well on Trazodone ?

## 2021-12-05 LAB — LIPID PANEL
Cholesterol: 153 mg/dL (ref <200)
HDL: 45 mg/dL (ref >=40)
LDL Cholesterol (Calc): 94 mg/dL (calc)
Non-HDL Cholesterol (Calc): 108 mg/dL (calc) (ref <130)
Total CHOL/HDL Ratio: 3.4 (calc) (ref <5.0)
Triglycerides: 56 mg/dL (ref <150)

## 2021-12-05 LAB — COMPLETE METABOLIC PANEL WITH GFR
AG Ratio: 2.4 (calc) (ref 1.0–2.5)
ALT: 8 U/L — ABNORMAL LOW (ref 9–46)
AST: 11 U/L (ref 10–40)
Albumin: 4.7 g/dL (ref 3.6–5.1)
Alkaline phosphatase (APISO): 66 U/L (ref 36–130)
BUN: 21 mg/dL (ref 7–25)
CO2: 23 mmol/L (ref 20–32)
Calcium: 9.4 mg/dL (ref 8.6–10.3)
Chloride: 105 mmol/L (ref 98–110)
Creat: 1.2 mg/dL (ref 0.60–1.26)
Globulin: 2 g/dL (calc) (ref 1.9–3.7)
Glucose, Bld: 91 mg/dL (ref 65–99)
Potassium: 4.4 mmol/L (ref 3.5–5.3)
Sodium: 140 mmol/L (ref 135–146)
Total Bilirubin: 0.8 mg/dL (ref 0.2–1.2)
Total Protein: 6.7 g/dL (ref 6.1–8.1)
eGFR: 80 mL/min/{1.73_m2} (ref >=60)

## 2021-12-05 LAB — CBC
HCT: 43.8 % (ref 38.5–50.0)
Hemoglobin: 14.9 g/dL (ref 13.2–17.1)
MCH: 28.9 pg (ref 27.0–33.0)
MCHC: 34 g/dL (ref 32.0–36.0)
MCV: 85 fL (ref 80.0–100.0)
MPV: 10.2 fL (ref 7.5–12.5)
Platelets: 185 10*3/uL (ref 140–400)
RBC: 5.15 10*6/uL (ref 4.20–5.80)
RDW: 12.3 % (ref 11.0–15.0)
WBC: 4.2 10*3/uL (ref 3.8–10.8)

## 2021-12-05 LAB — HEMOGLOBIN A1C
Hgb A1c MFr Bld: 5.1 % of total Hgb (ref <5.7)
Mean Plasma Glucose: 100 mg/dL
eAG (mmol/L): 5.5 mmol/L

## 2021-12-05 LAB — HEPATITIS C ANTIBODY
Hepatitis C Ab: NONREACTIVE
SIGNAL TO CUT-OFF: 0.18 (ref <1.00)

## 2021-12-22 ENCOUNTER — Other Ambulatory Visit: Payer: Self-pay | Admitting: Internal Medicine

## 2021-12-22 DIAGNOSIS — F902 Attention-deficit hyperactivity disorder, combined type: Secondary | ICD-10-CM

## 2021-12-24 MED ORDER — CONCERTA 27 MG PO TBCR: 27.0000 mg | EXTENDED_RELEASE_TABLET | Freq: Every day | ORAL | 0 refills | Status: DC

## 2021-12-24 NOTE — Telephone Encounter (Signed)
Requested Prescriptions  ?Pending Prescriptions Disp Refills  ?? traZODone (DESYREL) 50 MG tablet [Pharmacy Med Name: TRAZODONE 50MG  TABLETS] 30 tablet 0  ?  Sig: TAKE 1/2 TABLET BY MOUTH AT BEDTIME AS NEEDED FOR SLEEP  ?  ? Psychiatry: Antidepressants - Serotonin Modulator Passed - 12/24/2021  2:07 PM  ?  ?  Passed - Valid encounter within last 6 months  ?  Recent Outpatient Visits   ?      ? 2 weeks ago Primary hypertension  ? [facility], [place], NP  ? 5 months ago Encounter for general adult medical examination with abnormal findings  ? [facility] [place], [place], NP  ? 12 months ago Primary hypertension  ? [facility] [place], [place] W, NP  ? 1 year ago Attention deficit hyperactivity disorder (ADHD), combined type  ? [facility], FNP  ? 2 years ago Essential hypertension  ? [facility], [facility], FNP  ?  ?  ?Future Appointments   ?        ? In 6 months Baity, Jodelle Gross, NP [facility], [facility]  ?  ? ?  ?  ?  ?? CONCERTA 27 MG CR tablet 30 tablet 0  ?  Sig: Take 1 tablet (27 mg total) by mouth daily.  ?  ? Not Delegated - Psychiatry:  Stimulants/ADHD Failed - 12/24/2021  2:07 PM  ?  ?  Failed - This refill cannot be delegated  ?  ?  Failed - Urine Drug Screen completed in last 360 days  ?  ?  Passed - Last BP in normal range  ?  BP Readings from Last 1 Encounters:  ?12/04/21 124/82  ?   ?  ?  Passed - Last Heart Rate in normal range  ?  Pulse Readings from Last 1 Encounters:  ?12/04/21 72  ?   ?  ?  Passed - Valid encounter within last 6 months  ?  Recent Outpatient Visits   ?      ? 2 weeks ago Primary hypertension  ? [facility] [place], [place], NP  ? 5 months ago Encounter for general adult medical examination with abnormal findings  ? [facility] [place], [place]ins, NP  ? 12 months ago Primary hypertension  ? [facility], [place] W, NP  ? 1  year ago Attention deficit hyperactivity disorder (ADHD), combined type  ? [facility], [facility], FNP  ? 2 years ago Essential hypertension  ? [facility], [facility], FNP  ?  ?  ?Future Appointments   ?        ? In 6 months Baity, Jodelle Gross, NP [facility], [facility]  ?  ? ?  ?  ?  ? ?

## 2021-12-24 NOTE — Telephone Encounter (Signed)
Requested medications are due for refill today.  yes ? ?Requested medications are on the active medications list.  yes ? ?Last refill. 11/23/2021 ? ?Future visit scheduled.   yes ? ?Notes to clinic.  Medication refill is not delegated. ? ? ? ?Requested Prescriptions  ?Pending Prescriptions Disp Refills  ? CONCERTA 27 MG CR tablet 30 tablet 0  ?  Sig: Take 1 tablet (27 mg total) by mouth daily.  ?  ? Not Delegated - Psychiatry:  Stimulants/ADHD Failed - 12/24/2021  2:07 PM  ?  ?  Failed - This refill cannot be delegated  ?  ?  Failed - Urine Drug Screen completed in last 360 days  ?  ?  Passed - Last BP in normal range  ?  BP Readings from Last 1 Encounters:  ?12/04/21 124/82  ?  ?  ?  ?  Passed - Last Heart Rate in normal range  ?  Pulse Readings from Last 1 Encounters:  ?12/04/21 72  ?  ?  ?  ?  Passed - Valid encounter within last 6 months  ?  Recent Outpatient Visits   ? ?      ? 2 weeks ago Primary hypertension  ? [facility] [place], Coralie Keens, NP  ? 5 months ago Encounter for general adult medical examination with abnormal findings  ? [facility] [place], Coralie Keens, NP  ? 12 months ago Primary hypertension  ? [facility] [place], [place] W, NP  ? 1 year ago Attention deficit hyperactivity disorder (ADHD), combined type  ? [place], FNP  ? 2 years ago Essential hypertension  ? [facility], Lupita Raider, FNP  ? ?  ?  ?Future Appointments   ? ?        ? In 6 months Baity, Coralie Keens, NP [facility], [place]  ? ?  ? ? ?  ?  ?  ?Signed Prescriptions Disp Refills  ? traZODone (DESYREL) 50 MG tablet 30 tablet 0  ?  Sig: TAKE 1/2 TABLET BY MOUTH AT BEDTIME AS NEEDED FOR SLEEP  ?  ? Psychiatry: Antidepressants - Serotonin Modulator Passed - 12/24/2021  2:07 PM  ?  ?  Passed - Valid encounter within last 6 months  ?  Recent Outpatient Visits   ? ?      ? 2 weeks ago Primary hypertension  ? [facility] [place],  Coralie Keens, NP  ? 5 months ago Encounter for general adult medical examination with abnormal findings  ? [facility] [place], Coralie Keens, NP  ? 12 months ago Primary hypertension  ? [facility] [place], [place], NP  ? 1 year ago Attention deficit hyperactivity disorder (ADHD), combined type  ? [place], FNP  ? 2 years ago Essential hypertension  ? [facility], Lupita Raider, FNP  ? ?  ?  ?Future Appointments   ? ?        ? In 6 months Baity, Coralie Keens, NP [facility] [place]  ? ?  ? ? ?  ?  ?  ?  ?

## 2021-12-24 NOTE — Telephone Encounter (Signed)
Mom following up on refill request ?CONCERTA 27 MG CR tablet ? ?[id number] [place], [place] - [address] AT [facility] ? ? ? ? ? ?

## 2021-12-31 ENCOUNTER — Ambulatory Visit: Payer: Medicaid Other | Admitting: Internal Medicine

## 2022-01-22 ENCOUNTER — Other Ambulatory Visit: Payer: Self-pay | Admitting: Internal Medicine

## 2022-01-22 DIAGNOSIS — F902 Attention-deficit hyperactivity disorder, combined type: Secondary | ICD-10-CM

## 2022-01-22 NOTE — Telephone Encounter (Unsigned)
Copied from CRM [id number]. Topic: General - Other >> Jan 22, 2022 10:26 AM Gaetana Michaelis A wrote: Reason for CRM: Medication Refill - Medication: CONCERTA 27 MG CR tablet [[id number]]   Has the patient contacted their pharmacy? No.   (Agent: If no, request that the patient contact the pharmacy for the refill. If patient does not wish to contact the pharmacy document the reason why and proceed with request.) (Agent: If yes, when and what did the pharmacy advise?)  Preferred Pharmacy (with phone number or street name): [facility] #[id number] Nicholes Rough, [place] - [address] AT [facility] [address] [place] [address] Phone: [phone] Fax: [phone] Hours: Not open 24 hours   Has the patient been seen for an appointment in the last year OR does the patient have an upcoming appointment? Yes.    Agent: Please be advised that RX refills may take up to 3 business days. We ask that you follow-up with your pharmacy.

## 2022-01-23 NOTE — Telephone Encounter (Signed)
Requested medication (s) are due for refill today: yes  Requested medication (s) are on the active medication list: yes  Last refill:  12/24/21  Future visit scheduled: yes  Notes to clinic:  Unable to refill per protocol, cannot delegate.      Requested Prescriptions  Pending Prescriptions Disp Refills   CONCERTA 27 MG CR tablet 30 tablet 0    Sig: Take 1 tablet (27 mg total) by mouth daily.     Not Delegated - Psychiatry:  Stimulants/ADHD Failed - 01/22/2022  2:08 PM      Failed - This refill cannot be delegated      Failed - Urine Drug Screen completed in last 360 days      Passed - Last BP in normal range    BP Readings from Last 1 Encounters:  12/04/21 124/82         Passed - Last Heart Rate in normal range    Pulse Readings from Last 1 Encounters:  12/04/21 72         Passed - Valid encounter within last 6 months    Recent Outpatient Visits           1 month ago Primary hypertension   [facility] [place], Salvadore Oxford, NP   6 months ago Encounter for general adult medical examination with abnormal findings   [facility] [place], Salvadore Oxford, NP   1 year ago Primary hypertension   [facility] [place], Salvadore Oxford, NP   1 year ago Attention deficit hyperactivity disorder (ADHD), combined type   [facility], Jodelle Gross, FNP   2 years ago Essential hypertension   [facility], Jodelle Gross, FNP       Future Appointments             In 5 months Baity, Salvadore Oxford, NP [facility], [facility]

## 2022-01-24 MED ORDER — CONCERTA 27 MG PO TBCR: 27.0000 mg | EXTENDED_RELEASE_TABLET | Freq: Every day | ORAL | 0 refills | Status: DC

## 2022-02-18 ENCOUNTER — Other Ambulatory Visit: Payer: Self-pay | Admitting: Internal Medicine

## 2022-02-18 DIAGNOSIS — F902 Attention-deficit hyperactivity disorder, combined type: Secondary | ICD-10-CM

## 2022-02-18 NOTE — Telephone Encounter (Signed)
Medication Refill - Medication: CONCERTA 27 MG CR tablet   Has the patient contacted their pharmacy? Yes.   (Agent: If no, request that the patient contact the pharmacy for the refill. If patient does not wish to contact the pharmacy document the reason why and proceed with request.) (Agent: If yes, when and what did the pharmacy advise?)  Preferred Pharmacy (with phone number or street name):  [facility], [place] - [place] AT [facility]  [place] [address]  Phone: [phone] Fax: [phone]   Has the patient been seen for an appointment in the last year OR does the patient have an upcoming appointment? Yes.    Agent: Please be advised that RX refills may take up to 3 business days. We ask that you follow-up with your pharmacy.

## 2022-02-19 MED ORDER — CONCERTA 27 MG PO TBCR
27.0000 mg | EXTENDED_RELEASE_TABLET | Freq: Every day | ORAL | 0 refills | Status: DC
Start: 2022-02-19 — End: 2022-03-25

## 2022-02-20 ENCOUNTER — Other Ambulatory Visit: Payer: Self-pay | Admitting: Internal Medicine

## 2022-02-20 NOTE — Telephone Encounter (Signed)
Requested Prescriptions  Pending Prescriptions Disp Refills  . traZODone (DESYREL) 50 MG tablet [Pharmacy Med Name: TRAZODONE 50MG  TABLETS] 90 tablet 1    Sig: TAKE 1/2 TABLET BY MOUTH AT BEDTIME AS NEEDED FOR SLEEP     Psychiatry: Antidepressants - Serotonin Modulator Passed - 02/20/2022 10:15 AM      Passed - Valid encounter within last 6 months    Recent Outpatient Visits          2 months ago Primary hypertension   [facility] [place], [place], NP   7 months ago Encounter for general adult medical examination with abnormal findings   [facility] [place], Mullins, NP   1 year ago Primary hypertension   [facility] [place], Mullins, NP   1 year ago Attention deficit hyperactivity disorder (ADHD), combined type   [facility], [facility], FNP   2 years ago Essential hypertension   [facility], [facility], FNP      Future Appointments            In 4 months Baity, Jodelle Gross, NP [facility], [facility]

## 2022-03-25 ENCOUNTER — Other Ambulatory Visit: Payer: Self-pay | Admitting: Internal Medicine

## 2022-03-25 DIAGNOSIS — F902 Attention-deficit hyperactivity disorder, combined type: Secondary | ICD-10-CM

## 2022-03-25 NOTE — Telephone Encounter (Signed)
Requesting different dosing from what is listed on current medication list

## 2022-03-25 NOTE — Telephone Encounter (Signed)
Medication Refill - Medication: Concerta 35 mg  Has the patient contacted their pharmacy? Yes.   (Agent: If no, request that the patient contact the pharmacy for the refill. If patient does not wish to contact the pharmacy document the reason why and proceed with request.) (Agent: If yes, when and what did the pharmacy advise?)  Preferred Pharmacy (with phone number or street name): [facility] [address]  Has the patient been seen for an appointment in the last year OR does the patient have an upcoming appointment? Yes.    Agent: Please be advised that RX refills may take up to 3 business days. We ask that you follow-up with your pharmacy.

## 2022-03-26 MED ORDER — CONCERTA 27 MG PO TBCR
27.0000 mg | EXTENDED_RELEASE_TABLET | Freq: Every day | ORAL | 0 refills | Status: DC
Start: 2022-03-26 — End: 2022-04-23

## 2022-03-26 NOTE — Telephone Encounter (Signed)
Requested medication (s) are due for refill today: yes  Requested medication (s) are on the active medication list: yes, except pt asking for 35mg , unsure if forgot correct dose.  Last refill:  02/19/22 #30 with 0 RF  Future visit scheduled: 06/24/22, seen 12/04/21  Notes to clinic:  pt called asking for 35mg , unsure if that was a mistake, she has been on 27 for a long time. Not delegated, please assess.  Requested Prescriptions  Pending Prescriptions Disp Refills   CONCERTA 27 MG CR tablet 30 tablet 0    Sig: Take 1 tablet (27 mg total) by mouth daily.     Not Delegated - Psychiatry:  Stimulants/ADHD Failed - 03/25/2022 10:28 AM      Failed - This refill cannot be delegated      Failed - Urine Drug Screen completed in last 360 days      Passed - Last BP in normal range    BP Readings from Last 1 Encounters:  12/04/21 124/82         Passed - Last Heart Rate in normal range    Pulse Readings from Last 1 Encounters:  12/04/21 72         Passed - Valid encounter within last 6 months    Recent Outpatient Visits           3 months ago Primary hypertension   [facility] [place], [facility], NP   8 months ago Encounter for general adult medical examination with abnormal findings   [facility] [place], [facility], NP   1 year ago Primary hypertension   [facility] [place], [facility], NP   1 year ago Attention deficit hyperactivity disorder (ADHD), combined type   [facility], Salvadore Oxford, FNP   2 years ago Essential hypertension   [facility] [place], Jodelle Gross, FNP       Future Appointments             In 3 months Baity, [facility], NP [facility], [facility]

## 2022-04-16 ENCOUNTER — Telehealth: Payer: Self-pay | Admitting: Internal Medicine

## 2022-04-16 DIAGNOSIS — F902 Attention-deficit hyperactivity disorder, combined type: Secondary | ICD-10-CM

## 2022-04-16 NOTE — Telephone Encounter (Signed)
Requested medication (s) are due for refill today -no  Requested medication (s) are on the active medication list -yes  Future visit scheduled -yes  Last refill: 03/26/22 #30  Notes to clinic: non delegated Rx  Requested Prescriptions  Pending Prescriptions Disp Refills   CONCERTA 27 MG CR tablet 30 tablet 0    Sig: Take 1 tablet (27 mg total) by mouth daily.     Not Delegated - Psychiatry:  Stimulants/ADHD Failed - 04/16/2022  1:40 PM      Failed - This refill cannot be delegated      Failed - Urine Drug Screen completed in last 360 days      Passed - Last BP in normal range    BP Readings from Last 1 Encounters:  12/04/21 124/82         Passed - Last Heart Rate in normal range    Pulse Readings from Last 1 Encounters:  12/04/21 72         Passed - Valid encounter within last 6 months    Recent Outpatient Visits           4 months ago Primary hypertension   [facility] [place], Salvadore Oxford, NP   9 months ago Encounter for general adult medical examination with abnormal findings   [facility] [place], Salvadore Oxford, NP   1 year ago Primary hypertension   [facility] [place], Salvadore Oxford, NP   1 year ago Attention deficit hyperactivity disorder (ADHD), combined type   [facility], Jodelle Gross, FNP   2 years ago Essential hypertension   [facility], Jodelle Gross, FNP       Future Appointments             In 2 months [place], Salvadore Oxford, NP [facility], [facility]               Requested Prescriptions  Pending Prescriptions Disp Refills   CONCERTA 27 MG CR tablet 30 tablet 0    Sig: Take 1 tablet (27 mg total) by mouth daily.     Not Delegated - Psychiatry:  Stimulants/ADHD Failed - 04/16/2022  1:40 PM      Failed - This refill cannot be delegated      Failed - Urine Drug Screen completed in last 360 days      Passed - Last BP in normal range    BP Readings from Last 1  Encounters:  12/04/21 124/82         Passed - Last Heart Rate in normal range    Pulse Readings from Last 1 Encounters:  12/04/21 72         Passed - Valid encounter within last 6 months    Recent Outpatient Visits           4 months ago Primary hypertension   [facility] [place], Salvadore Oxford, NP   9 months ago Encounter for general adult medical examination with abnormal findings   [facility] [place], Salvadore Oxford, NP   1 year ago Primary hypertension   [facility] [place], Salvadore Oxford, NP   1 year ago Attention deficit hyperactivity disorder (ADHD), combined type   [facility], Jodelle Gross, FNP   2 years ago Essential hypertension   [facility], Jodelle Gross, [place]       Future Appointments  In 2 months Baity, Salvadore Oxford, NP [facility], [facility]

## 2022-04-16 NOTE — Telephone Encounter (Signed)
Medication Refill - Medication: CONCERTA 27 MG CR tablet   Has the patient contacted their pharmacy? No.   Preferred Pharmacy (with phone number or street name):  [id number] - [place], [place] - [address] AT [address] Phone: [phone]  Fax: [phone]     Has the patient been seen for an appointment in the last year OR does the patient have an upcoming appointment? Yes.    Agent: Please be advised that RX refills may take up to 3 business days. We ask that you follow-up with your pharmacy.   

## 2022-04-23 MED ORDER — CONCERTA 27 MG PO TBCR
27.0000 mg | EXTENDED_RELEASE_TABLET | Freq: Every day | ORAL | 0 refills | Status: DC
Start: 2022-04-23 — End: 2022-05-20

## 2022-04-23 NOTE — Telephone Encounter (Signed)
Left message advising Ms. Hershkowitz that she may pick up Lottie'  Concerta on or after 09/02.  PEC please advise if she calls back.   Thanks,   -Vernona Rieger

## 2022-04-23 NOTE — Addendum Note (Signed)
Addended by: Lorre Munroe on: 04/23/2022 09:27 AM   Modules accepted: Orders

## 2022-04-23 NOTE — Telephone Encounter (Signed)
Refill was denied because she requested it way too early.  These are controlled substance but can only bill filled every 30 days.

## 2022-04-23 NOTE — Telephone Encounter (Signed)
Caller would like to know why medication was not sent to the pharmacy and request was sent in on 04/16/2022. Caller would like a follow up call today  [facility][id number] [place], [place] - [address] AT [facility] Phone:  [phone]  Fax:  [phone]

## 2022-05-20 ENCOUNTER — Other Ambulatory Visit: Payer: Self-pay | Admitting: Internal Medicine

## 2022-05-20 DIAGNOSIS — J302 Other seasonal allergic rhinitis: Secondary | ICD-10-CM

## 2022-05-20 DIAGNOSIS — F902 Attention-deficit hyperactivity disorder, combined type: Secondary | ICD-10-CM

## 2022-05-20 NOTE — Telephone Encounter (Signed)
Medication Refill - Medication: Generic Claritin 10 mg  Has the patient contacted their pharmacy? Yes.   (Agent: If no, request that the patient contact the pharmacy for the refill. If patient does not wish to contact the pharmacy document the reason why and proceed with request.) (Agent: If yes, when and what did the pharmacy advise?)  Preferred Pharmacy (with phone number or street name): [facility] Has the patient been seen for an appointment in the last year OR does the patient have an upcoming appointment? Yes.    Agent: Please be advised that RX refills may take up to 3 business days. We ask that you follow-up with your pharmacy.

## 2022-05-20 NOTE — Telephone Encounter (Signed)
Medication Refill - Medication: CONCERTA 27 MG CR tablet  Has the patient contacted their pharmacy? No. Mom calls every month  Preferred Pharmacy (with phone number or street name): [facility] #[id number] [place], [place] AT [facility] Has the patient been seen for an appointment in the last year OR does the patient have an upcoming appointment? Yes.    Agent: Please be advised that RX refills may take up to 3 business days. We ask that you follow-up with your pharmacy.

## 2022-05-21 MED ORDER — CONCERTA 27 MG PO TBCR
27.0000 mg | EXTENDED_RELEASE_TABLET | Freq: Every day | ORAL | 0 refills | Status: DC
Start: 2022-05-21 — End: 2022-06-12

## 2022-05-21 MED ORDER — LORATADINE 10 MG PO TABS: 10.0000 mg | ORAL_TABLET | Freq: Every day | ORAL | 0 refills | Status: DC

## 2022-05-21 NOTE — Telephone Encounter (Signed)
Requested Prescriptions  Pending Prescriptions Disp Refills  . loratadine (CLARITIN) 10 MG tablet 90 tablet 0    Sig: Take 1 tablet (10 mg total) by mouth daily.     Ear, Nose, and Throat:  Antihistamines 2 Passed - 05/20/2022 12:28 PM      Passed - Cr in normal range and within 360 days    Creat  Date Value Ref Range Status  12/04/2021 1.20 0.60 - 1.26 mg/dL Final         Passed - Valid encounter within last 12 months    Recent Outpatient Visits          5 months ago Primary hypertension   [place], Coralie Keens, NP   10 months ago Encounter for general adult medical examination with abnormal findings   [facility] [place], Coralie Keens, NP   1 year ago Primary hypertension   [facility] [place], Coralie Keens, NP   1 year ago Attention deficit hyperactivity disorder (ADHD), combined type   [facility], FNP   2 years ago Essential hypertension   [facility], Lupita Raider, FNP      Future Appointments            In 1 month Baity, Coralie Keens, NP [facility] [place], [facility]

## 2022-05-21 NOTE — Telephone Encounter (Signed)
Requested medication (s) are due for refill today: yes  Requested medication (s) are on the active medication list: yes  Last refill:  04/23/22  Future visit scheduled:yes  Notes to clinic:  Unable to refill per protocol, cannot delegate.      Requested Prescriptions  Pending Prescriptions Disp Refills   CONCERTA 27 MG CR tablet 30 tablet 0    Sig: Take 1 tablet (27 mg total) by mouth daily.     Not Delegated - Psychiatry:  Stimulants/ADHD Failed - 05/20/2022 10:13 AM      Failed - This refill cannot be delegated      Failed - Urine Drug Screen completed in last 360 days      Passed - Last BP in normal range    BP Readings from Last 1 Encounters:  12/04/21 124/82         Passed - Last Heart Rate in normal range    Pulse Readings from Last 1 Encounters:  12/04/21 72         Passed - Valid encounter within last 6 months    Recent Outpatient Visits           5 months ago Primary hypertension   [place], Coralie Keens, NP   10 months ago Encounter for general adult medical examination with abnormal findings   [facility] [place], Coralie Keens, NP   1 year ago Primary hypertension   [facility] [place], Coralie Keens, NP   1 year ago Attention deficit hyperactivity disorder (ADHD), combined type   [place], FNP   2 years ago Essential hypertension   [facility], Lupita Raider, FNP       Future Appointments             In 1 month Baity, Coralie Keens, NP [facility], [facility]

## 2022-06-12 ENCOUNTER — Other Ambulatory Visit: Payer: Self-pay | Admitting: Internal Medicine

## 2022-06-12 DIAGNOSIS — F902 Attention-deficit hyperactivity disorder, combined type: Secondary | ICD-10-CM

## 2022-06-12 NOTE — Telephone Encounter (Signed)
Medication Refill - Medication: CONCERTA 27 MG CR tablet  Has the patient contacted their pharmacy? No. (Agent: If no, request that the patient contact the pharmacy for the refill. If patient does not wish to contact the pharmacy document the reason why and proceed with request.)   Preferred Pharmacy (with phone number or street name):  [facility] [place], [place] - [place] AT [facility]  [place] [address]  Phone: [phone] Fax: [phone]  Hours: Not open 24 hours   Has the patient been seen for an appointment in the last year OR does the patient have an upcoming appointment? Yes.    Agent: Please be advised that RX refills may take up to 3 business days. We ask that you follow-up with your pharmacy.

## 2022-06-12 NOTE — Telephone Encounter (Signed)
Requested medication (s) are due for refill today: yes  Requested medication (s) are on the active medication list: yes  Last refill:  05/21/22 #30 with 0 RF  Future visit scheduled: 07/05/22, seen 12/04/21  Notes to clinic:  This medication can not be delegated, please assess.  This may be a duplicate.      Requested Prescriptions  Pending Prescriptions Disp Refills   CONCERTA 27 MG CR tablet 30 tablet 0    Sig: Take 1 tablet (27 mg total) by mouth daily.     Not Delegated - Psychiatry:  Stimulants/ADHD Failed - 06/12/2022 10:06 AM      Failed - This refill cannot be delegated      Failed - Urine Drug Screen completed in last 360 days      Failed - Valid encounter within last 6 months    Recent Outpatient Visits           6 months ago Primary hypertension   [place], Coralie Keens, NP   11 months ago Encounter for general adult medical examination with abnormal findings   [facility], Coralie Keens, NP   1 year ago Primary hypertension   [facility] [place], Coralie Keens, NP   2 years ago Attention deficit hyperactivity disorder (ADHD), combined type   [place], FNP   2 years ago Essential hypertension   [place], FNP       Future Appointments             In 3 weeks [place], Coralie Keens, NP [place] BP in normal range    BP Readings from Last 1 Encounters:  12/04/21 124/82         Passed - Last Heart Rate in normal range    Pulse Readings from Last 1 Encounters:  12/04/21 72

## 2022-06-13 MED ORDER — CONCERTA 27 MG PO TBCR: 27.0000 mg | EXTENDED_RELEASE_TABLET | Freq: Every day | ORAL | 0 refills | Status: DC

## 2022-06-24 ENCOUNTER — Ambulatory Visit: Payer: Medicaid Other | Admitting: Internal Medicine

## 2022-07-05 ENCOUNTER — Encounter: Payer: Self-pay | Admitting: Internal Medicine

## 2022-07-05 ENCOUNTER — Ambulatory Visit (INDEPENDENT_AMBULATORY_CARE_PROVIDER_SITE_OTHER): Payer: Medicaid Other | Admitting: Internal Medicine

## 2022-07-05 VITALS — BP 118/84 | HR 88 | Temp 97.1°F | Wt 159.0 lb

## 2022-07-05 DIAGNOSIS — Z0001 Encounter for general adult medical examination with abnormal findings: Secondary | ICD-10-CM | POA: Diagnosis not present

## 2022-07-05 DIAGNOSIS — E7800 Pure hypercholesterolemia, unspecified: Secondary | ICD-10-CM | POA: Diagnosis not present

## 2022-07-05 DIAGNOSIS — Z23 Encounter for immunization: Secondary | ICD-10-CM

## 2022-07-05 LAB — COMPLETE METABOLIC PANEL WITH GFR
AG Ratio: 2.2 (calc) (ref 1.0–2.5)
ALT: 13 U/L (ref 9–46)
AST: 11 U/L (ref 10–40)
Albumin: 4.8 g/dL (ref 3.6–5.1)
Alkaline phosphatase (APISO): 63 U/L (ref 36–130)
BUN: 24 mg/dL (ref 7–25)
CO2: 30 mmol/L (ref 20–32)
Calcium: 9.6 mg/dL (ref 8.6–10.3)
Chloride: 103 mmol/L (ref 98–110)
Creat: 1.21 mg/dL (ref 0.60–1.26)
Globulin: 2.2 g/dL (calc) (ref 1.9–3.7)
Glucose, Bld: 87 mg/dL (ref 65–99)
Potassium: 4.3 mmol/L (ref 3.5–5.3)
Sodium: 140 mmol/L (ref 135–146)
Total Bilirubin: 1 mg/dL (ref 0.2–1.2)
Total Protein: 7 g/dL (ref 6.1–8.1)
eGFR: 80 mL/min/{1.73_m2} (ref >=60)

## 2022-07-05 LAB — LIPID PANEL
Cholesterol: 156 mg/dL (ref <200)
HDL: 44 mg/dL (ref >=40)
LDL Cholesterol (Calc): 97 mg/dL (calc)
Non-HDL Cholesterol (Calc): 112 mg/dL (calc) (ref <130)
Total CHOL/HDL Ratio: 3.5 (calc) (ref <5.0)
Triglycerides: 61 mg/dL (ref <150)

## 2022-07-05 NOTE — Progress Notes (Signed)
Subjective:    Patient ID: Ian Lam, male    DOB: 1986-04-27, 36 y.o.   MRN: [id number]  HPI  Patient presents to clinic today for his annual exam.  Flu: 06/2021 Tetanus: 02/2019 COVID: X3 Dentist: As needed  Diet: He does eat meat.  He consumes some fruits and vegetables.  He does eat fried foods.  He drinks mostly soda and water. Exercise: None  Review of Systems     Past Medical History:  Diagnosis Date   ADHD    Allergy    Fragile X syndrome in male    Hypertension     Current Outpatient Medications  Medication Sig Dispense Refill   CONCERTA 27 MG CR tablet Take 1 tablet (27 mg total) by mouth daily. 30 tablet 0   lisinopril (ZESTRIL) 20 MG tablet Take 1 tablet (20 mg total) by mouth daily. 90 tablet 2   loratadine (CLARITIN) 10 MG tablet Take 1 tablet (10 mg total) by mouth daily. 90 tablet 0   traZODone (DESYREL) 50 MG tablet TAKE 1/2 TABLET BY MOUTH AT BEDTIME AS NEEDED FOR SLEEP 90 tablet 1   No current facility-administered medications for this visit.    No Known Allergies  Family History  Problem Relation Age of Onset   Fragile X syndrome Brother     Social History   Socioeconomic History   Marital status: Single    Spouse name: Not on file   Number of children: Not on file   Years of education: 12   Highest education level: Not on file  Occupational History   Not on file  Tobacco Use   Smoking status: Never   Smokeless tobacco: Never  Vaping Use   Vaping Use: Never used  Substance and Sexual Activity   Alcohol use: Never   Drug use: Never   Sexual activity: Not Currently  Other Topics Concern   Not on file  Social History Narrative   Not on file   Social Determinants of Health   Financial Resource Strain: Not on file  Food Insecurity: Not on file  Transportation Needs: Not on file  Physical Activity: Not on file  Stress: Not on file  Social Connections: Not on file  Intimate Partner Violence: Not on file      Constitutional: Denies fever, malaise, fatigue, headache or abrupt weight changes.  HEENT: Denies eye pain, eye redness, ear pain, ringing in the ears, wax buildup, runny nose, nasal congestion, bloody nose, or sore throat. Respiratory: Denies difficulty breathing, shortness of breath, cough or sputum production.   Cardiovascular: Denies chest pain, chest tightness, palpitations or swelling in the hands or feet.  Gastrointestinal: Denies abdominal pain, bloating, constipation, diarrhea or blood in the stool.  GU: Denies urgency, frequency, pain with urination, burning sensation, blood in urine, odor or discharge. Musculoskeletal: Denies decrease in range of motion, difficulty with gait, muscle pain or joint pain and swelling.  Skin: Denies redness, rashes, lesions or ulcercations.  Neurological: Patient reports inattention, developmental delay.  Denies dizziness, difficulty with memory, difficulty with speech or problems with balance and coordination.  Psych: Denies anxiety, depression, SI/HI.  No other specific complaints in a complete review of systems (except as listed in HPI above).  Objective:   Physical Exam  BP 118/84 (BP Location: Left Arm, Patient Position: Sitting, Cuff Size: Normal)   Pulse 88   Temp (!) 97.1 F (36.2 C) (Temporal)   Wt 159 lb (72.1 kg)   SpO2 99%   BMI 23.48  kg/m   Wt Readings from Last 3 Encounters:  12/04/21 170 lb (77.1 kg)  07/03/21 173 lb 3.2 oz (78.6 kg)  12/26/20 168 lb 9.6 oz (76.5 kg)    General: Appears his stated age, well developed, well nourished in NAD. Skin: Warm, dry and intact. HEENT: Head: normal shape and size; Eyes: sclera white, no icterus, conjunctiva pink, PERRLA and EOMs intact;  Neck:  Neck supple, trachea midline. No masses, lumps or thyromegaly present.  Cardiovascular: Normal rate and rhythm. S1,S2 noted.  No murmur, rubs or gallops noted. No JVD or BLE edema.  Pulmonary/Chest: Normal effort and positive vesicular  breath sounds. No respiratory distress. No wheezes, rales or ronchi noted.  Abdomen: Normal bowel sounds.  Musculoskeletal:  No difficulty with gait.  Neurological: Alert.  Difficulty following commands.  Engages verbally very minimally. Psychiatric: Mood and affect normal. Behavior is normal. Judgment and thought content normal.    BMET    Component Value Date/Time   NA 140 12/04/2021 0930   K 4.4 12/04/2021 0930   CL 105 12/04/2021 0930   CO2 23 12/04/2021 0930   GLUCOSE 91 12/04/2021 0930   BUN 21 12/04/2021 0930   CREATININE 1.20 12/04/2021 0930   CALCIUM 9.4 12/04/2021 0930   GFRNONAA 72 06/02/2020 0954   GFRAA 83 06/02/2020 0954    Lipid Panel     Component Value Date/Time   CHOL 153 12/04/2021 0930   TRIG 56 12/04/2021 0930   HDL 45 12/04/2021 0930   CHOLHDL 3.4 12/04/2021 0930   LDLCALC 94 12/04/2021 0930    CBC    Component Value Date/Time   WBC 4.2 12/04/2021 0930   RBC 5.15 12/04/2021 0930   HGB 14.9 12/04/2021 0930   HCT 43.8 12/04/2021 0930   PLT 185 12/04/2021 0930   MCV 85.0 12/04/2021 0930   MCH 28.9 12/04/2021 0930   MCHC 34.0 12/04/2021 0930   RDW 12.3 12/04/2021 0930   LYMPHSABS 1,717 06/02/2020 0954   EOSABS 19 06/02/2020 0954   BASOSABS 31 06/02/2020 0954    Hgb A1C Lab Results  Component Value Date   HGBA1C 5.1 12/04/2021            Assessment & Plan:   Preventative Health Maintenance:  Flu shot today Tetanus UTD Encouraged him to get his COVID booster Encouraged him to consume a balanced diet and exercise regimen Advised him to see a dentist annually We will check c-Met and lipid profile today  RTC in 6 months, follow-up chronic conditions Webb Silversmith, NP

## 2022-07-05 NOTE — Patient Instructions (Signed)
Health Maintenance, Male Adopting a healthy lifestyle and getting preventive care are important in promoting health and wellness. Ask your health care provider about: The right schedule for you to have regular tests and exams. Things you can do on your own to prevent diseases and keep yourself healthy. What should I know about diet, weight, and exercise? Eat a healthy diet  Eat a diet that includes plenty of vegetables, fruits, low-fat dairy products, and lean protein. Do not eat a lot of foods that are high in solid fats, added sugars, or sodium. Maintain a healthy weight Body mass index (BMI) is a measurement that can be used to identify possible weight problems. It estimates body fat based on height and weight. Your health care provider can help determine your BMI and help you achieve or maintain a healthy weight. Get regular exercise Get regular exercise. This is one of the most important things you can do for your health. Most adults should: Exercise for at least 150 minutes each week. The exercise should increase your heart rate and make you sweat (moderate-intensity exercise). Do strengthening exercises at least twice a week. This is in addition to the moderate-intensity exercise. Spend less time sitting. Even light physical activity can be beneficial. Watch cholesterol and blood lipids Have your blood tested for lipids and cholesterol at 36 years of age, then have this test every 5 years. You may need to have your cholesterol levels checked more often if: Your lipid or cholesterol levels are high. You are older than 36 years of age. You are at high risk for heart disease. What should I know about cancer screening? Many types of cancers can be detected early and may often be prevented. Depending on your health history and family history, you may need to have cancer screening at various ages. This may include screening for: Colorectal cancer. Prostate cancer. Skin cancer. Lung  cancer. What should I know about heart disease, diabetes, and high blood pressure? Blood pressure and heart disease High blood pressure causes heart disease and increases the risk of stroke. This is more likely to develop in people who have high blood pressure readings or are overweight. Talk with your health care provider about your target blood pressure readings. Have your blood pressure checked: Every 3-5 years if you are 18-39 years of age. Every year if you are 40 years old or older. If you are between the ages of 65 and 75 and are a current or former smoker, ask your health care provider if you should have a one-time screening for abdominal aortic aneurysm (AAA). Diabetes Have regular diabetes screenings. This checks your fasting blood sugar level. Have the screening done: Once every three years after age 45 if you are at a normal weight and have a low risk for diabetes. More often and at a younger age if you are overweight or have a high risk for diabetes. What should I know about preventing infection? Hepatitis B If you have a higher risk for hepatitis B, you should be screened for this virus. Talk with your health care provider to find out if you are at risk for hepatitis B infection. Hepatitis C Blood testing is recommended for: Everyone born from 1945 through 1965. Anyone with known risk factors for hepatitis C. Sexually transmitted infections (STIs) You should be screened each year for STIs, including gonorrhea and chlamydia, if: You are sexually active and are younger than 36 years of age. You are older than 36 years of age and your   health care provider tells you that you are at risk for this type of infection. Your sexual activity has changed since you were last screened, and you are at increased risk for chlamydia or gonorrhea. Ask your health care provider if you are at risk. Ask your health care provider about whether you are at high risk for HIV. Your health care provider  may recommend a prescription medicine to help prevent HIV infection. If you choose to take medicine to prevent HIV, you should first get tested for HIV. You should then be tested every 3 months for as long as you are taking the medicine. Follow these instructions at home: Alcohol use Do not drink alcohol if your health care provider tells you not to drink. If you drink alcohol: Limit how much you have to 0-2 drinks a day. Know how much alcohol is in your drink. In the [place] one drink equals one 12 oz bottle of beer (355 mL), one 5 oz glass of wine (148 mL), or one 1 oz glass of hard liquor (44 mL). Lifestyle Do not use any products that contain nicotine or tobacco. These products include cigarettes, chewing tobacco, and vaping devices, such as e-cigarettes. If you need help quitting, ask your health care provider. Do not use street drugs. Do not share needles. Ask your health care provider for help if you need support or information about quitting drugs. General instructions Schedule regular health, dental, and eye exams. Stay current with your vaccines. Tell your health care provider if: You often feel depressed. You have ever been abused or do not feel safe at home. Summary Adopting a healthy lifestyle and getting preventive care are important in promoting health and wellness. Follow your health care provider's instructions about healthy diet, exercising, and getting tested or screened for diseases. Follow your health care provider's instructions on monitoring your cholesterol and blood pressure. This information is not intended to replace advice given to you by your health care provider. Make sure you discuss any questions you have with your health care provider. Document Revised: 01/01/2021 Document Reviewed: 01/01/2021 Elsevier Patient Education  2023 Elsevier Inc.  

## 2022-07-12 ENCOUNTER — Other Ambulatory Visit: Payer: Self-pay | Admitting: Internal Medicine

## 2022-07-12 DIAGNOSIS — F902 Attention-deficit hyperactivity disorder, combined type: Secondary | ICD-10-CM

## 2022-07-12 NOTE — Telephone Encounter (Signed)
Medication Refill - Medication: CONCERTA 27 MG CR tablet   Has the patient contacted their pharmacy? No.  Preferred Pharmacy (with phone number or street name): [facility] #[id number] [address], [place] - [address] AT [facility]  Has the patient been seen for an appointment in the last year OR does the patient have an upcoming appointment? Yes.    Agent: Please be advised that RX refills may take up to 3 business days. We ask that you follow-up with your pharmacy.

## 2022-07-12 NOTE — Telephone Encounter (Signed)
Requested medications are due for refill today.  yes  Requested medications are on the active medications list.  yes  Last refill. 06/13/2022 330 0 rf  Future visit scheduled.   yes  Notes to clinic.  Refill not delegated.    Requested Prescriptions  Pending Prescriptions Disp Refills   CONCERTA 27 MG CR tablet 30 tablet 0    Sig: Take 1 tablet (27 mg total) by mouth daily.     Not Delegated - Psychiatry:  Stimulants/ADHD Failed - 07/12/2022  3:54 PM      Failed - This refill cannot be delegated      Failed - Urine Drug Screen completed in last 360 days      Passed - Last BP in normal range    BP Readings from Last 1 Encounters:  07/05/22 118/84         Passed - Last Heart Rate in normal range    Pulse Readings from Last 1 Encounters:  07/05/22 88         Passed - Valid encounter within last 6 months    Recent Outpatient Visits           1 week ago Encounter for general adult medical examination with abnormal findings   [facility] [place], Salvadore Oxford, NP   7 months ago Primary hypertension   [facility] [place], Salvadore Oxford, NP   1 year ago Encounter for general adult medical examination with abnormal findings   [facility] [place], Salvadore Oxford, NP   1 year ago Primary hypertension   [facility] [place], Salvadore Oxford, NP   2 years ago Attention deficit hyperactivity disorder (ADHD), combined type   [facility], Jodelle Gross, FNP       Future Appointments             In 6 months Baity, Salvadore Oxford, NP [facility], [facility]

## 2022-07-15 MED ORDER — CONCERTA 27 MG PO TBCR: 27.0000 mg | EXTENDED_RELEASE_TABLET | Freq: Every day | ORAL | 0 refills | Status: DC

## 2022-07-29 ENCOUNTER — Other Ambulatory Visit: Payer: Self-pay | Admitting: Internal Medicine

## 2022-07-29 DIAGNOSIS — I10 Essential (primary) hypertension: Secondary | ICD-10-CM

## 2022-07-30 NOTE — Telephone Encounter (Signed)
Requested Prescriptions  Pending Prescriptions Disp Refills   lisinopril (ZESTRIL) 20 MG tablet [Pharmacy Med Name: LISINOPRIL 20MG  TABLETS] 90 tablet 1    Sig: TAKE 1 TABLET BY MOUTH DAILY     Cardiovascular:  ACE Inhibitors Passed - 07/29/2022  5:16 PM      Passed - Cr in normal range and within 180 days    Creat  Date Value Ref Range Status  07/05/2022 1.21 0.60 - 1.26 mg/dL Final         Passed - K in normal range and within 180 days    Potassium  Date Value Ref Range Status  07/05/2022 4.3 3.5 - 5.3 mmol/L Final         Passed - Patient is not pregnant      Passed - Last BP in normal range    BP Readings from Last 1 Encounters:  07/05/22 118/84         Passed - Valid encounter within last 6 months    Recent Outpatient Visits           3 weeks ago Encounter for general adult medical examination with abnormal findings   [facility] [place], Mullins, NP   7 months ago Primary hypertension   [facility] [place], Mullins, NP   1 year ago Encounter for general adult medical examination with abnormal findings   [facility], Mullins, NP   1 year ago Primary hypertension   [facility] [place], Mullins, NP   2 years ago Attention deficit hyperactivity disorder (ADHD), combined type   [facility], [facility], FNP       Future Appointments             In 5 months Baity, Jodelle Gross, NP [facility], [facility]

## 2022-08-03 ENCOUNTER — Other Ambulatory Visit: Payer: Self-pay | Admitting: Internal Medicine

## 2022-08-03 DIAGNOSIS — J302 Other seasonal allergic rhinitis: Secondary | ICD-10-CM

## 2022-08-05 NOTE — Telephone Encounter (Signed)
Requested Prescriptions  Pending Prescriptions Disp Refills   loratadine (CLARITIN) 10 MG tablet [Pharmacy Med Name: LORATADINE 10MG  TABLETS] 90 tablet 0    Sig: TAKE 1 TABLET(10 MG) BY MOUTH DAILY     Ear, Nose, and Throat:  Antihistamines 2 Passed - 08/03/2022 10:36 AM      Passed - Cr in normal range and within 360 days    Creat  Date Value Ref Range Status  07/05/2022 1.21 0.60 - 1.26 mg/dL Final         Passed - Valid encounter within last 12 months    Recent Outpatient Visits           1 month ago Encounter for general adult medical examination with abnormal findings   [facility] [place], Mullins, NP   8 months ago Primary hypertension   [facility] [place], Mullins, NP   1 year ago Encounter for general adult medical examination with abnormal findings   [facility] [place], Mullins, NP   1 year ago Primary hypertension   [facility] [place], Mullins, NP   2 years ago Attention deficit hyperactivity disorder (ADHD), combined type   [facility], [facility], FNP       Future Appointments             In 5 months Baity, Jodelle Gross, NP [facility], [facility]

## 2022-08-12 ENCOUNTER — Other Ambulatory Visit: Payer: Self-pay | Admitting: Internal Medicine

## 2022-08-12 DIAGNOSIS — F902 Attention-deficit hyperactivity disorder, combined type: Secondary | ICD-10-CM

## 2022-08-12 NOTE — Telephone Encounter (Signed)
Requested medication (s) are due for refill today: yes  Requested medication (s) are on the active medication list: yes  Last refill:  07/15/22 #30/0  Future visit scheduled: yes  Notes to clinic:  Unable to refill per protocol, cannot delegate.      Requested Prescriptions  Pending Prescriptions Disp Refills   CONCERTA 27 MG CR tablet 30 tablet 0    Sig: Take 1 tablet (27 mg total) by mouth daily.     Not Delegated - Psychiatry:  Stimulants/ADHD Failed - 08/12/2022 12:06 PM      Failed - This refill cannot be delegated      Failed - Urine Drug Screen completed in last 360 days      Passed - Last BP in normal range    BP Readings from Last 1 Encounters:  07/05/22 118/84         Passed - Last Heart Rate in normal range    Pulse Readings from Last 1 Encounters:  07/05/22 88         Passed - Valid encounter within last 6 months    Recent Outpatient Visits           1 month ago Encounter for general adult medical examination with abnormal findings   [facility] [place], Salvadore Oxford, NP   8 months ago Primary hypertension   [facility] [place], Salvadore Oxford, NP   1 year ago Encounter for general adult medical examination with abnormal findings   [facility] [place], Salvadore Oxford, NP   1 year ago Primary hypertension   [facility] [place], Salvadore Oxford, NP   2 years ago Attention deficit hyperactivity disorder (ADHD), combined type   [facility] [place], Jodelle Gross, FNP       Future Appointments             In 5 months Baity, Salvadore Oxford, NP [facility], [facility]

## 2022-08-12 NOTE — Telephone Encounter (Signed)
Medication Refill - Medication: CONCERTA 27 MG CR tablet   Has the patient contacted their pharmacy? No. No, more refills.   (Agent: If no, request that the patient contact the pharmacy for the refill. If patient does not wish to contact the pharmacy document the reason why and proceed with request.)   Preferred Pharmacy (with phone number or street name):  [facility] #[id number] [address], [place] - [address] AT [facility]  [address] [place] [address]  Phone: [phone] Fax: [phone]  Hours: Not open 24 hours   Has the patient been seen for an appointment in the last year OR does the patient have an upcoming appointment? Yes.    Agent: Please be advised that RX refills may take up to 3 business days. We ask that you follow-up with your pharmacy.

## 2022-08-13 MED ORDER — CONCERTA 27 MG PO TBCR: 27.0000 mg | EXTENDED_RELEASE_TABLET | Freq: Every day | ORAL | 0 refills | Status: DC

## 2022-09-23 ENCOUNTER — Telehealth: Payer: Self-pay | Admitting: Internal Medicine

## 2022-09-23 DIAGNOSIS — F902 Attention-deficit hyperactivity disorder, combined type: Secondary | ICD-10-CM

## 2022-09-23 MED ORDER — CONCERTA 27 MG PO TBCR: 27.0000 mg | EXTENDED_RELEASE_TABLET | Freq: Every day | ORAL | 0 refills | Status: DC

## 2022-09-23 NOTE — Telephone Encounter (Signed)
Medication refilled

## 2022-09-23 NOTE — Addendum Note (Signed)
Addended by: Jearld Fenton on: 09/23/2022 03:21 PM   Modules accepted: Orders

## 2022-09-23 NOTE — Telephone Encounter (Signed)
Medication Refill - Medication: CONCERTA 27 MG CR tablet   Has the patient contacted their pharmacy? Yes.   (Agent: If no, request that the patient contact the pharmacy for the refill. If patient does not wish to contact the pharmacy document the reason why and proceed with request.) (Agent: If yes, when and what did the pharmacy advise?)  Preferred Pharmacy (with phone number or street name):  [facility], [place] - [place] AT [facility]  [place] [address]  Phone: [phone] Fax: [phone]   Has the patient been seen for an appointment in the last year OR does the patient have an upcoming appointment? Yes.    Agent: Please be advised that RX refills may take up to 3 business days. We ask that you follow-up with your pharmacy.

## 2022-10-14 ENCOUNTER — Other Ambulatory Visit: Payer: Self-pay | Admitting: Internal Medicine

## 2022-10-14 DIAGNOSIS — F902 Attention-deficit hyperactivity disorder, combined type: Secondary | ICD-10-CM

## 2022-10-14 NOTE — Telephone Encounter (Signed)
Medication Refill - Medication: CONCERTA 27 MG CR tablet [id number]    Has the patient contacted their pharmacy? Yes.   (Agent: If no, request that the patient contact the pharmacy for the refill. If patient does not wish to contact the pharmacy document the reason why and proceed with request.) (Agent: If yes, when and what did the pharmacy advise?)  Preferred Pharmacy (with phone number or street name):  [facility] [id number], [place] AT [facility]   Has the patient been seen for an appointment in the last year OR does the patient have an upcoming appointment? Yes.    Agent: Please be advised that RX refills may take up to 3 business days. We ask that you follow-up with your pharmacy.

## 2022-10-16 MED ORDER — CONCERTA 27 MG PO TBCR: 27.0000 mg | EXTENDED_RELEASE_TABLET | Freq: Every day | ORAL | 0 refills | Status: DC

## 2022-10-16 NOTE — Telephone Encounter (Signed)
Requested medication (s) are due for refill today: yes  Requested medication (s) are on the active medication list: yes  Last refill:  09/23/22  Future visit scheduled: yes  Notes to clinic:  Unable to refill per protocol, cannot delegate.      Requested Prescriptions  Pending Prescriptions Disp Refills   CONCERTA 27 MG CR tablet 30 tablet 0    Sig: Take 1 tablet (27 mg total) by mouth daily.     Not Delegated - Psychiatry:  Stimulants/ADHD Failed - 10/14/2022  5:23 PM      Failed - This refill cannot be delegated      Failed - Urine Drug Screen completed in last 360 days      Passed - Last BP in normal range    BP Readings from Last 1 Encounters:  07/05/22 118/84         Passed - Last Heart Rate in normal range    Pulse Readings from Last 1 Encounters:  07/05/22 88         Passed - Valid encounter within last 6 months    Recent Outpatient Visits           3 months ago Encounter for general adult medical examination with abnormal findings   [facility] [place], Coralie Keens, NP   10 months ago Primary hypertension   [facility] [place], Coralie Keens, NP   1 year ago Encounter for general adult medical examination with abnormal findings   [facility] [place], Coralie Keens, NP   1 year ago Primary hypertension   [facility] [place], Coralie Keens, NP   2 years ago Attention deficit hyperactivity disorder (ADHD), combined type   [place], FNP       Future Appointments             In 1 week Baity, Coralie Keens, NP [facility], [place]   In 3 months [place], Coralie Keens, NP [facility], [facility]

## 2022-10-21 ENCOUNTER — Other Ambulatory Visit: Payer: Self-pay | Admitting: Internal Medicine

## 2022-10-21 DIAGNOSIS — J302 Other seasonal allergic rhinitis: Secondary | ICD-10-CM

## 2022-10-22 NOTE — Telephone Encounter (Signed)
Requested medication (s) are due for refill today: yes  Requested medication (s) are on the active medication list: yes  Last refill:  08/05/22 #90 0 refills  Future visit scheduled: yes in 1 week   Notes to clinic:  no refills remain. Do you want to refill Rx?     Requested Prescriptions  Pending Prescriptions Disp Refills   loratadine (CLARITIN) 10 MG tablet [Pharmacy Med Name: LORATADINE '10MG'$  TABLETS] 90 tablet 0    Sig: TAKE 1 TABLET(10 MG) BY MOUTH DAILY     Ear, Nose, and Throat:  Antihistamines 2 Passed - 10/21/2022 11:07 AM      Passed - Cr in normal range and within 360 days    Creat  Date Value Ref Range Status  07/05/2022 1.21 0.60 - 1.26 mg/dL Final         Passed - Valid encounter within last 12 months    Recent Outpatient Visits           3 months ago Encounter for general adult medical examination with abnormal findings   [facility] [place], Coralie Keens, NP   10 months ago Primary hypertension   [facility] [place], Coralie Keens, NP   1 year ago Encounter for general adult medical examination with abnormal findings   [facility] [place], Coralie Keens, NP   1 year ago Primary hypertension   [facility] [place], Coralie Keens, NP   2 years ago Attention deficit hyperactivity disorder (ADHD), combined type   [place], FNP       Future Appointments             In 1 week Baity, Coralie Keens, NP [facility], [place]   In 2 months [place], Coralie Keens, NP [facility], [facility]

## 2022-10-29 ENCOUNTER — Ambulatory Visit (INDEPENDENT_AMBULATORY_CARE_PROVIDER_SITE_OTHER): Payer: Medicaid Other | Admitting: Internal Medicine

## 2022-10-29 ENCOUNTER — Encounter: Payer: Self-pay | Admitting: Internal Medicine

## 2022-10-29 VITALS — BP 116/72 | HR 87 | Temp 96.8°F | Wt 157.0 lb

## 2022-10-29 DIAGNOSIS — F902 Attention-deficit hyperactivity disorder, combined type: Secondary | ICD-10-CM | POA: Diagnosis not present

## 2022-10-29 DIAGNOSIS — R6250 Unspecified lack of expected normal physiological development in childhood: Secondary | ICD-10-CM

## 2022-10-29 DIAGNOSIS — I10 Essential (primary) hypertension: Secondary | ICD-10-CM | POA: Diagnosis not present

## 2022-10-29 DIAGNOSIS — E7800 Pure hypercholesterolemia, unspecified: Secondary | ICD-10-CM

## 2022-10-29 DIAGNOSIS — G4701 Insomnia due to medical condition: Secondary | ICD-10-CM

## 2022-10-29 MED ORDER — FEXOFENADINE HCL 180 MG PO TABS: 180.0000 mg | ORAL_TABLET | Freq: Every day | ORAL | 1 refills | Status: DC

## 2022-10-29 NOTE — Assessment & Plan Note (Signed)
Continue Concerta

## 2022-10-29 NOTE — Assessment & Plan Note (Signed)
Continue trazodone 

## 2022-10-29 NOTE — Assessment & Plan Note (Signed)
C-Met and lipid profile today Encouraged him to consume low-fat diet 

## 2022-10-29 NOTE — Patient Instructions (Signed)

## 2022-10-29 NOTE — Assessment & Plan Note (Signed)
Controlled on lisinopril Reinforced DASH diet C-Met today

## 2022-10-29 NOTE — Progress Notes (Signed)
Subjective:    Patient ID: Ian Lam, male    DOB: 1986/01/16, 37 y.o.   MRN: [id number]  HPI  Patient presents to clinic today for follow-up of chronic conditions.  HTN: His BP today is 116/72.  He is taking Lisinopril as prescribed.  There is no ECG on file.  ADHD/Developmental Delay: Managed on Concerta.  He does not follow with psychiatry.  HLD: His last LDL was 97, triglycerides 61, 06/2022.  He is not taking any cholesterol-lowering medication at this time.  He tries to consume low-fat diet.  Insomnia: He has difficulty falling and staying asleep.  He is taking Trazodone as prescribed.  There is no sleep study on file.  Review of Systems  Past Medical History:  Diagnosis Date   ADHD    Allergy    Fragile X syndrome in male    Hypertension     Current Outpatient Medications  Medication Sig Dispense Refill   CONCERTA 27 MG CR tablet Take 1 tablet (27 mg total) by mouth daily. 30 tablet 0   lisinopril (ZESTRIL) 20 MG tablet TAKE 1 TABLET BY MOUTH DAILY 90 tablet 1   traZODone (DESYREL) 50 MG tablet TAKE 1/2 TABLET BY MOUTH AT BEDTIME AS NEEDED FOR SLEEP 90 tablet 1   loratadine (CLARITIN) 10 MG tablet TAKE 1 TABLET(10 MG) BY MOUTH DAILY (Patient not taking: Reported on 10/29/2022) 90 tablet 0   No current facility-administered medications for this visit.    No Known Allergies  Family History  Problem Relation Age of Onset   Fragile X syndrome Brother     Social History   Socioeconomic History   Marital status: Single    Spouse name: Not on file   Number of children: Not on file   Years of education: 12   Highest education level: Not on file  Occupational History   Not on file  Tobacco Use   Smoking status: Never   Smokeless tobacco: Never  Vaping Use   Vaping Use: Never used  Substance and Sexual Activity   Alcohol use: Never   Drug use: Never   Sexual activity: Not Currently  Other Topics Concern   Not on file  Social History Narrative   Not  on file   Social Determinants of Health   Financial Resource Strain: Not on file  Food Insecurity: Not on file  Transportation Needs: Not on file  Physical Activity: Not on file  Stress: Not on file  Social Connections: Not on file  Intimate Partner Violence: Not on file     Constitutional: Denies fever, malaise, fatigue, headache or abrupt weight changes.  HEENT: Denies eye pain, eye redness, ear pain, ringing in the ears, wax buildup, runny nose, nasal congestion, bloody nose, or sore throat. Respiratory: Denies difficulty breathing, shortness of breath, cough or sputum production.   Cardiovascular: Denies chest pain, chest tightness, palpitations or swelling in the hands or feet.  Gastrointestinal: Denies abdominal pain, bloating, constipation, diarrhea or blood in the stool.  GU: Denies urgency, frequency, pain with urination, burning sensation, blood in urine, odor or discharge. Musculoskeletal: Denies decrease in range of motion, difficulty with gait, muscle pain or joint pain and swelling.  Skin: Denies redness, rashes, lesions or ulcercations.  Neurological: Patient reports insomnia.  Denies dizziness, difficulty with memory, difficulty with speech or problems with balance and coordination.  Psych: Denies anxiety, depression, SI/HI.  No other specific complaints in a complete review of systems (except as listed in HPI above).  Objective:   Physical Exam  BP 116/72 (BP Location: Left Arm, Patient Position: Sitting, Cuff Size: Normal)   Pulse 87   Temp (!) 96.8 F (36 C) (Temporal)   Wt 157 lb (71.2 kg)   SpO2 99%   BMI 23.18 kg/m   Wt Readings from Last 3 Encounters:  10/29/22 157 lb (71.2 kg)  07/05/22 159 lb (72.1 kg)  12/04/21 170 lb (77.1 kg)    General: Appears his stated age, well developed, well nourished, developmental delay but in NAD. Skin: Warm, dry and intact. No rashes noted. HEENT: Head: normal shape and size; Eyes: sclera white, no icterus,  conjunctiva pink, PERRLA and EOMs intact;  Cardiovascular: Normal rate and rhythm. S1,S2 noted.  No murmur, rubs or gallops noted. No JVD or BLE edema. No carotid bruits noted. Pulmonary/Chest: Normal effort and positive vesicular breath sounds. No respiratory distress. No wheezes, rales or ronchi noted.  Abdomen: Soft and nontender. Normal bowel sounds.  Musculoskeletal: No difficulty with gait.  Neurological: Alert and oriented.      BMET    Component Value Date/Time   NA 140 07/05/2022 0849   K 4.3 07/05/2022 0849   CL 103 07/05/2022 0849   CO2 30 07/05/2022 0849   GLUCOSE 87 07/05/2022 0849   BUN 24 07/05/2022 0849   CREATININE 1.21 07/05/2022 0849   CALCIUM 9.6 07/05/2022 0849   GFRNONAA 72 06/02/2020 0954   GFRAA 83 06/02/2020 0954    Lipid Panel     Component Value Date/Time   CHOL 156 07/05/2022 0849   TRIG 61 07/05/2022 0849   HDL 44 07/05/2022 0849   CHOLHDL 3.5 07/05/2022 0849   LDLCALC 97 07/05/2022 0849    CBC    Component Value Date/Time   WBC 4.2 12/04/2021 0930   RBC 5.15 12/04/2021 0930   HGB 14.9 12/04/2021 0930   HCT 43.8 12/04/2021 0930   PLT 185 12/04/2021 0930   MCV 85.0 12/04/2021 0930   MCH 28.9 12/04/2021 0930   MCHC 34.0 12/04/2021 0930   RDW 12.3 12/04/2021 0930   LYMPHSABS 1,717 06/02/2020 0954   EOSABS 19 06/02/2020 0954   BASOSABS 31 06/02/2020 0954    Hgb A1C Lab Results  Component Value Date   HGBA1C 5.1 12/04/2021           Assessment & Plan:     RTC in 6 months, follow-up chronic conditions Webb Silversmith, NP

## 2022-10-30 ENCOUNTER — Telehealth: Payer: Self-pay | Admitting: Internal Medicine

## 2022-10-30 LAB — COMPLETE METABOLIC PANEL WITH GFR
AG Ratio: 2.1 (calc) (ref 1.0–2.5)
ALT: 10 U/L (ref 9–46)
AST: 11 U/L (ref 10–40)
Albumin: 4.4 g/dL (ref 3.6–5.1)
Alkaline phosphatase (APISO): 62 U/L (ref 36–130)
BUN: 22 mg/dL (ref 7–25)
CO2: 28 mmol/L (ref 20–32)
Calcium: 9.3 mg/dL (ref 8.6–10.3)
Chloride: 106 mmol/L (ref 98–110)
Creat: 1.13 mg/dL (ref 0.60–1.26)
Globulin: 2.1 g/dL (calc) (ref 1.9–3.7)
Glucose, Bld: 81 mg/dL (ref 65–99)
Potassium: 4.3 mmol/L (ref 3.5–5.3)
Sodium: 142 mmol/L (ref 135–146)
Total Bilirubin: 0.5 mg/dL (ref 0.2–1.2)
Total Protein: 6.5 g/dL (ref 6.1–8.1)
eGFR: 86 mL/min/{1.73_m2} (ref >=60)

## 2022-10-30 LAB — LIPID PANEL
Cholesterol: 153 mg/dL (ref <200)
HDL: 37 mg/dL — ABNORMAL LOW (ref >=40)
LDL Cholesterol (Calc): 96 mg/dL (calc)
Non-HDL Cholesterol (Calc): 116 mg/dL (calc) (ref <130)
Total CHOL/HDL Ratio: 4.1 (calc) (ref <5.0)
Triglycerides: 103 mg/dL (ref <150)

## 2022-10-30 NOTE — Telephone Encounter (Signed)
Mom given lab results. Asking for allergy medication be changed back to Claritin, Cannot swallow the larger pill.

## 2022-10-31 MED ORDER — LORATADINE 10 MG PO TABS: 10.0000 mg | ORAL_TABLET | Freq: Every day | ORAL | 1 refills | Status: DC

## 2022-10-31 NOTE — Addendum Note (Signed)
Addended by: Jearld Fenton on: 10/31/2022 09:25 AM   Modules accepted: Orders

## 2022-11-15 ENCOUNTER — Other Ambulatory Visit: Payer: Self-pay | Admitting: Internal Medicine

## 2022-11-15 DIAGNOSIS — F902 Attention-deficit hyperactivity disorder, combined type: Secondary | ICD-10-CM

## 2022-11-15 NOTE — Telephone Encounter (Signed)
Medication Refill - Medication: CONCERTA 27 MG CR tablet   Has the patient contacted their pharmacy? No.   Preferred Pharmacy (with phone number or street name):  [facility] [id number] [place], [place] [facility] Phone: [phone]  Fax: [phone]     Has the patient been seen for an appointment in the last year OR does the patient have an upcoming appointment? Yes.    Agent: Please be advised that RX refills may take up to 3 business days. We ask that you follow-up with your pharmacy.

## 2022-11-18 MED ORDER — CONCERTA 27 MG PO TBCR: 27.0000 mg | EXTENDED_RELEASE_TABLET | Freq: Every day | ORAL | 0 refills | Status: DC

## 2022-11-18 NOTE — Telephone Encounter (Signed)
Requested medications are due for refill today.  Provider to determine  Requested medications are on the active medications list.  yes  Last refill. 10/16/2022 #30 0 rf  Future visit scheduled.   yes  Notes to clinic.  Refill not delegated.    Requested Prescriptions  Pending Prescriptions Disp Refills   CONCERTA 27 MG CR tablet 30 tablet 0    Sig: Take 1 tablet (27 mg total) by mouth daily.     Not Delegated - Psychiatry:  Stimulants/ADHD Failed - 11/15/2022 11:20 AM      Failed - This refill cannot be delegated      Failed - Urine Drug Screen completed in last 360 days      Passed - Last BP in normal range    BP Readings from Last 1 Encounters:  10/29/22 116/72         Passed - Last Heart Rate in normal range    Pulse Readings from Last 1 Encounters:  10/29/22 87         Passed - Valid encounter within last 6 months    Recent Outpatient Visits           2 weeks ago Primary hypertension   [place], Coralie Keens, NP   4 months ago Encounter for general adult medical examination with abnormal findings   [facility] [place], Coralie Keens, NP   11 months ago Primary hypertension   [facility] [place], Coralie Keens, NP   1 year ago Encounter for general adult medical examination with abnormal findings   [facility] [place], Coralie Keens, NP   1 year ago Primary hypertension   [facility] [place], Coralie Keens, NP       Future Appointments             In 1 month [place], Coralie Keens, NP [facility], [facility]

## 2022-12-20 ENCOUNTER — Other Ambulatory Visit: Payer: Self-pay | Admitting: Internal Medicine

## 2022-12-20 DIAGNOSIS — F902 Attention-deficit hyperactivity disorder, combined type: Secondary | ICD-10-CM

## 2022-12-20 MED ORDER — CONCERTA 27 MG PO TBCR: 27.0000 mg | EXTENDED_RELEASE_TABLET | Freq: Every day | ORAL | 0 refills | Status: DC

## 2022-12-20 NOTE — Telephone Encounter (Signed)
Medication Refill - Medication: CONCERTA 27 MG CR tablet   Has the patient contacted their pharmacy? No. (Preferred Pharmacy (with phone number or street name):  [facility] ST[facility] #[id number] [place], [place] - [address] AT [facility]  [address] [place] [address]  Phone: [phone] Fax: [phone]  Hours: Not open 24 hours     Has the patient been seen for an appointment in the last year OR does the patient have an upcoming appointment? Yes.    Agent: Please be advised that RX refills may take up to 3 business days. We ask that you follow-up with your pharmacy.

## 2022-12-20 NOTE — Telephone Encounter (Signed)
Requested medication (s) are due for refill today - yes  Requested medication (s) are on the active medication list -yes  Future visit scheduled -yes  Last refill: 11/18/22 #30  Notes to clinic: non delegated Rx  Requested Prescriptions  Pending Prescriptions Disp Refills   CONCERTA 27 MG CR tablet 30 tablet 0    Sig: Take 1 tablet (27 mg total) by mouth daily.     Not Delegated - Psychiatry:  Stimulants/ADHD Failed - 12/20/2022 11:45 AM      Failed - This refill cannot be delegated      Failed - Urine Drug Screen completed in last 360 days      Passed - Last BP in normal range    BP Readings from Last 1 Encounters:  10/29/22 116/72         Passed - Last Heart Rate in normal range    Pulse Readings from Last 1 Encounters:  10/29/22 87         Passed - Valid encounter within last 6 months    Recent Outpatient Visits           1 month ago Primary hypertension   [place] [facility] [place], Salvadore Oxford, NP   5 months ago Encounter for general adult medical examination with abnormal findings   [place] [facility] [place], Salvadore Oxford, NP   1 year ago Primary hypertension   [place] [facility] [place], Salvadore Oxford, NP   1 year ago Encounter for general adult medical examination with abnormal findings   [place] [facility] [place], Salvadore Oxford, NP   1 year ago Primary hypertension   [place] [facility] [place], Salvadore Oxford, NP       Future Appointments             In 1 month [place], Salvadore Oxford, NP [place] [facility], [facility]               Requested Prescriptions  Pending Prescriptions Disp Refills   CONCERTA 27 MG CR tablet 30 tablet 0    Sig: Take 1 tablet (27 mg total) by mouth daily.     Not Delegated - Psychiatry:  Stimulants/ADHD Failed - 12/20/2022 11:45 AM      Failed - This refill cannot be delegated      Failed - Urine Drug Screen completed in last 360 days       Passed - Last BP in normal range    BP Readings from Last 1 Encounters:  10/29/22 116/72         Passed - Last Heart Rate in normal range    Pulse Readings from Last 1 Encounters:  10/29/22 87         Passed - Valid encounter within last 6 months    Recent Outpatient Visits           1 month ago Primary hypertension   [place] [facility] [place], Salvadore Oxford, NP   5 months ago Encounter for general adult medical examination with abnormal findings   [facility] [place], Salvadore Oxford, NP   1 year ago Primary hypertension    [facility] [place], Salvadore Oxford, NP   1 year ago Encounter for general adult medical examination with abnormal findings    [facility] [place], Salvadore Oxford, NP   1 year ago  Primary hypertension   [place] [facility] [place], Salvadore Oxford, NP       Future Appointments             In 1 month [place], Salvadore Oxford, NP  [facility], [place]

## 2023-01-14 ENCOUNTER — Other Ambulatory Visit: Payer: Self-pay | Admitting: Internal Medicine

## 2023-01-14 DIAGNOSIS — F902 Attention-deficit hyperactivity disorder, combined type: Secondary | ICD-10-CM

## 2023-01-14 NOTE — Telephone Encounter (Signed)
Medication Refill - Medication: CONCERTA 27 MG CR tablet [[id number]]   Has the patient contacted their pharmacy? Yes.     (Agent: If yes, when and what did the pharmacy advise?) Contact PCP   Preferred Pharmacy (with phone number or street name): [facility]ORE #[id number] [place], [place] - [address] AT [facility]   Has the patient been seen for an appointment in the last year OR does the patient have an upcoming appointment? Yes.    Agent: Please be advised that RX refills may take up to 3 business days. We ask that you follow-up with your pharmacy.

## 2023-01-15 ENCOUNTER — Ambulatory Visit: Payer: Medicaid Other | Admitting: Internal Medicine

## 2023-01-15 MED ORDER — CONCERTA 27 MG PO TBCR: 27.0000 mg | EXTENDED_RELEASE_TABLET | Freq: Every day | ORAL | 0 refills | Status: DC

## 2023-01-15 NOTE — Telephone Encounter (Signed)
Requested medication (s) are due for refill today: Yes  Requested medication (s) are on the active medication list: Yes  Last refill:  12/20/22  Future visit scheduled: Yes  Notes to clinic:  See request.    Requested Prescriptions  Pending Prescriptions Disp Refills   CONCERTA 27 MG CR tablet 30 tablet 0    Sig: Take 1 tablet (27 mg total) by mouth daily.     Not Delegated - Psychiatry:  Stimulants/ADHD Failed - 01/14/2023  3:26 PM      Failed - This refill cannot be delegated      Failed - Urine Drug Screen completed in last 360 days      Passed - Last BP in normal range    BP Readings from Last 1 Encounters:  10/29/22 116/72         Passed - Last Heart Rate in normal range    Pulse Readings from Last 1 Encounters:  10/29/22 87         Passed - Valid encounter within last 6 months    Recent Outpatient Visits           2 months ago Primary hypertension   [place] [facility] [place], Salvadore Oxford, NP   6 months ago Encounter for general adult medical examination with abnormal findings   [place] [facility] [place], Salvadore Oxford, NP   1 year ago Primary hypertension   [place] [facility] [place], Salvadore Oxford, NP   1 year ago Encounter for general adult medical examination with abnormal findings   [place] [facility] [place], Salvadore Oxford, NP   2 years ago Primary hypertension   [place] [facility] [place], Salvadore Oxford, NP       Future Appointments             In 2 weeks [place], Salvadore Oxford, NP [place] [facility], [facility]

## 2023-02-04 ENCOUNTER — Ambulatory Visit: Payer: Medicaid Other | Admitting: Internal Medicine

## 2023-02-04 ENCOUNTER — Other Ambulatory Visit: Payer: Self-pay | Admitting: Internal Medicine

## 2023-02-04 MED ORDER — LORATADINE 10 MG PO TABS: 10.0000 mg | ORAL_TABLET | Freq: Every day | ORAL | 1 refills | Status: DC

## 2023-02-19 ENCOUNTER — Other Ambulatory Visit: Payer: Self-pay | Admitting: Internal Medicine

## 2023-02-19 DIAGNOSIS — F902 Attention-deficit hyperactivity disorder, combined type: Secondary | ICD-10-CM

## 2023-02-19 NOTE — Telephone Encounter (Signed)
Medication Refill - Medication: CONCERTA 27 MG CR tablet   Has the patient contacted their pharmacy? No.  Preferred Pharmacy (with phone number or street name):  [facility]ORE #[id number] - [place], [place] - [address] AT [facility] OF [address] [facility] Phone: [phone]  Fax: [phone]     Has the patient been seen for an appointment in the last year OR does the patient have an upcoming appointment? No.  Agent: Please be advised that RX refills may take up to 3 business days. We ask that you follow-up with your pharmacy.

## 2023-02-20 MED ORDER — CONCERTA 27 MG PO TBCR: 27.0000 mg | EXTENDED_RELEASE_TABLET | Freq: Every day | ORAL | 0 refills | Status: DC

## 2023-02-20 NOTE — Telephone Encounter (Signed)
Requested medication (s) are due for refill today: yes  Requested medication (s) are on the active medication list: yes  Last refill:  01/15/23  Future visit scheduled: yes  Notes to clinic:  Unable to refill per protocol, cannot delegate.      Requested Prescriptions  Pending Prescriptions Disp Refills   CONCERTA 27 MG CR tablet 30 tablet 0    Sig: Take 1 tablet (27 mg total) by mouth daily.     Not Delegated - Psychiatry:  Stimulants/ADHD Failed - 02/19/2023 11:25 AM      Failed - This refill cannot be delegated      Failed - Urine Drug Screen completed in last 360 days      Passed - Last BP in normal range    BP Readings from Last 1 Encounters:  10/29/22 116/72         Passed - Last Heart Rate in normal range    Pulse Readings from Last 1 Encounters:  10/29/22 87         Passed - Valid encounter within last 6 months    Recent Outpatient Visits           3 months ago Primary hypertension   [place] [facility] [place], Salvadore Oxford, NP   7 months ago Encounter for general adult medical examination with abnormal findings   [place] [facility] [place], Salvadore Oxford, NP   1 year ago Primary hypertension   [place] [facility] [place], Salvadore Oxford, NP   1 year ago Encounter for general adult medical examination with abnormal findings   [place] [facility] [place], Salvadore Oxford, NP   2 years ago Primary hypertension   [place] [facility] [place], Salvadore Oxford, NP       Future Appointments             In 2 months [place], Salvadore Oxford, NP [facility], [facility]

## 2023-03-17 ENCOUNTER — Other Ambulatory Visit: Payer: Self-pay | Admitting: Internal Medicine

## 2023-03-17 DIAGNOSIS — F902 Attention-deficit hyperactivity disorder, combined type: Secondary | ICD-10-CM

## 2023-03-17 NOTE — Telephone Encounter (Signed)
Medication Refill - Medication: CONCERTA 27 MG CR tablet   Has the patient contacted their pharmacy? No.   (Preferred Pharmacy (with phone number or street name):  [facility] #[id number] [place], [place] - [address] AT [facility] OF [address] [facility] [address] [place], [place] [address] Phone: [phone]  Fax: [phone]      Has the patient been seen for an appointment in the last year OR does the patient have an upcoming appointment? Yes.    Agent: Please be advised that RX refills may take up to 3 business days. We ask that you follow-up with your pharmacy.

## 2023-03-18 NOTE — Telephone Encounter (Signed)
Requested medication (s) are due for refill today: Yes  Requested medication (s) are on the active medication list: Yes  Last refill:  02/20/23  Future visit scheduled: Yes  Notes to clinic:  Unable to refill per protocol, cannot delegate.      Requested Prescriptions  Pending Prescriptions Disp Refills   CONCERTA 27 MG CR tablet 30 tablet 0    Sig: Take 1 tablet (27 mg total) by mouth daily.     Not Delegated - Psychiatry:  Stimulants/ADHD Failed - 03/17/2023 11:44 AM      Failed - This refill cannot be delegated      Failed - Urine Drug Screen completed in last 360 days      Passed - Last BP in normal range    BP Readings from Last 1 Encounters:  10/29/22 116/72         Passed - Last Heart Rate in normal range    Pulse Readings from Last 1 Encounters:  10/29/22 87         Passed - Valid encounter within last 6 months    Recent Outpatient Visits           4 months ago Primary hypertension   [place] [facility] [place], Salvadore Oxford, NP   8 months ago Encounter for general adult medical examination with abnormal findings   [place] [facility] [place], Salvadore Oxford, NP   1 year ago Primary hypertension   [facility] [place], Salvadore Oxford, NP   1 year ago Encounter for general adult medical examination with abnormal findings   [place] [facility] [place], Salvadore Oxford, NP   2 years ago Primary hypertension   [place] [facility] [place], Salvadore Oxford, NP       Future Appointments             In 1 month [place], Salvadore Oxford, NP [place] [facility], [place] [facility]

## 2023-03-19 MED ORDER — CONCERTA 27 MG PO TBCR: 27.0000 mg | EXTENDED_RELEASE_TABLET | Freq: Every day | ORAL | 0 refills | Status: DC

## 2023-03-24 ENCOUNTER — Other Ambulatory Visit: Payer: Self-pay | Admitting: Internal Medicine

## 2023-03-24 DIAGNOSIS — I10 Essential (primary) hypertension: Secondary | ICD-10-CM

## 2023-03-24 NOTE — Telephone Encounter (Signed)
Please note new pharmacy for the pt. [facility] #[id number] - [place], [place] - [address] AT [facility] OF [address] [facility]

## 2023-03-25 NOTE — Telephone Encounter (Signed)
Requested Prescriptions  Pending Prescriptions Disp Refills   lisinopril (ZESTRIL) 20 MG tablet [Pharmacy Med Name: LISINOPRIL 20MG  TABLETS] 90 tablet 1    Sig: TAKE 1 TABLET BY MOUTH DAILY     Cardiovascular:  ACE Inhibitors Passed - 03/24/2023  9:10 AM      Passed - Cr in normal range and within 180 days    Creat  Date Value Ref Range Status  10/29/2022 1.13 0.60 - 1.26 mg/dL Final         Passed - K in normal range and within 180 days    Potassium  Date Value Ref Range Status  10/29/2022 4.3 3.5 - 5.3 mmol/L Final         Passed - Patient is not pregnant      Passed - Last BP in normal range    BP Readings from Last 1 Encounters:  10/29/22 116/72         Passed - Valid encounter within last 6 months    Recent Outpatient Visits           4 months ago Primary hypertension   [place] [facility] [place], Salvadore Oxford, NP   8 months ago Encounter for general adult medical examination with abnormal findings   [place] [place], Salvadore Oxford, NP   1 year ago Primary hypertension   [place] [facility] [place], Salvadore Oxford, NP   1 year ago Encounter for general adult medical examination with abnormal findings   [place] [facility] [place], Salvadore Oxford, NP   2 years ago Primary hypertension   [place] [facility] [place], Salvadore Oxford, NP       Future Appointments             In 1 month [place], Salvadore Oxford, NP  [facility], [facility]

## 2023-04-18 ENCOUNTER — Other Ambulatory Visit: Payer: Self-pay | Admitting: Internal Medicine

## 2023-04-18 DIAGNOSIS — F902 Attention-deficit hyperactivity disorder, combined type: Secondary | ICD-10-CM

## 2023-04-18 NOTE — Telephone Encounter (Signed)
Medication Refill - Medication: CONCERTA 27 MG CR tablet   Has the patient contacted their pharmacy? No/pt states has to call in every month for the refill on this particular med   Preferred Pharmacy (with phone number or street name):  [facility] #[id number] [place], [place] - [address] AT [facility] OF [address] [facility] Phone: [phone]  Fax: [phone]     Has the patient been seen for an appointment in the last year OR does the patient have an upcoming appointment? yes  Agent: Please be advised that RX refills may take up to 3 business days. We ask that you follow-up with your pharmacy.

## 2023-04-21 MED ORDER — CONCERTA 27 MG PO TBCR: 27.0000 mg | EXTENDED_RELEASE_TABLET | Freq: Every day | ORAL | 0 refills | Status: DC

## 2023-04-21 NOTE — Telephone Encounter (Signed)
Requested medication (s) are due for refill today: yes  Requested medication (s) are on the active medication list: yes  Last refill:  03/19/23  Future visit scheduled: yes  Notes to clinic:  Unable to refill per protocol, cannot delegate.      Requested Prescriptions  Pending Prescriptions Disp Refills   CONCERTA 27 MG CR tablet 30 tablet 0    Sig: Take 1 tablet (27 mg total) by mouth daily.     Not Delegated - Psychiatry:  Stimulants/ADHD Failed - 04/18/2023  1:54 PM      Failed - This refill cannot be delegated      Failed - Urine Drug Screen completed in last 360 days      Passed - Last BP in normal range    BP Readings from Last 1 Encounters:  10/29/22 116/72         Passed - Last Heart Rate in normal range    Pulse Readings from Last 1 Encounters:  10/29/22 87         Passed - Valid encounter within last 6 months    Recent Outpatient Visits           5 months ago Primary hypertension   [place] [facility] [place], Salvadore Oxford, NP   9 months ago Encounter for general adult medical examination with abnormal findings   [place] [facility] [place], Salvadore Oxford, NP   1 year ago Primary hypertension   [place] [facility] [place], Salvadore Oxford, NP   1 year ago Encounter for general adult medical examination with abnormal findings   [place] [facility] [place], Salvadore Oxford, NP   2 years ago Primary hypertension   [place] [place], Salvadore Oxford, NP       Future Appointments             In 2 weeks [place], Salvadore Oxford, NP [facility], [facility]

## 2023-05-07 ENCOUNTER — Ambulatory Visit: Payer: Medicare Other | Admitting: Internal Medicine

## 2023-05-21 ENCOUNTER — Other Ambulatory Visit: Payer: Self-pay | Admitting: Internal Medicine

## 2023-05-21 DIAGNOSIS — F902 Attention-deficit hyperactivity disorder, combined type: Secondary | ICD-10-CM

## 2023-05-21 NOTE — Telephone Encounter (Signed)
Medication Refill - Medication:  CONCERTA 27 MG CR table  Has the patient contacted their pharmacy? No. (Agent: If no, request that the patient contact the pharmacy for the refill. If patient does not wish to contact the pharmacy document the reason why and proceed with request.) (Agent: If yes, when and what did the pharmacy advise?)  Preferred Pharmacy (with phone number or street name): [id number] - [place],  - [address] AT [facility] OF [address]  Has the patient been seen for an appointment in the last year OR does the patient have an upcoming appointment? Yes.    Agent: Please be advised that RX refills may take up to 3 business days. We ask that you follow-up with your pharmacy.

## 2023-05-22 MED ORDER — CONCERTA 27 MG PO TBCR: 27.0000 mg | EXTENDED_RELEASE_TABLET | Freq: Every day | ORAL | 0 refills | Status: DC

## 2023-05-22 NOTE — Telephone Encounter (Signed)
Requested medication (s) are due for refill today: yes  Requested medication (s) are on the active medication list: yes    Last refill: 04/21/23  #30  0 refills  Future visit scheduled   yes   07/01/23  Notes to clinic: Not delegated, please review. Thank you.  Requested Prescriptions  Pending Prescriptions Disp Refills   CONCERTA 27 MG CR tablet 30 tablet 0    Sig: Take 1 tablet (27 mg total) by mouth daily.     Not Delegated - Psychiatry:  Stimulants/ADHD Failed - 05/21/2023 11:24 AM      Failed - This refill cannot be delegated      Failed - Urine Drug Screen completed in last 360 days      Failed - Valid encounter within last 6 months    Recent Outpatient Visits           6 months ago Primary hypertension   [place] [facility] [place], Salvadore Oxford, NP   10 months ago Encounter for general adult medical examination with abnormal findings   [facility] [place], Salvadore Oxford, NP   1 year ago Primary hypertension   [place] [facility] [place], Salvadore Oxford, NP   1 year ago Encounter for general adult medical examination with abnormal findings   [place] [facility] [place], Salvadore Oxford, NP   2 years ago Primary hypertension   [facility] [place], Salvadore Oxford, NP       Future Appointments             In 1 month Baity, Salvadore Oxford, NP  [facility]            Passed - Last BP in normal range    BP Readings from Last 1 Encounters:  10/29/22 116/72         Passed - Last Heart Rate in normal range    Pulse Readings from Last 1 Encounters:  10/29/22 87

## 2023-06-18 ENCOUNTER — Other Ambulatory Visit: Payer: Self-pay | Admitting: Internal Medicine

## 2023-06-18 DIAGNOSIS — F902 Attention-deficit hyperactivity disorder, combined type: Secondary | ICD-10-CM

## 2023-06-18 NOTE — Telephone Encounter (Signed)
Medication Refill - Medication: CONCERTA 27 MG CR tablet   Has the patient contacted their pharmacy? Yes.     Preferred Pharmacy (with phone number or street name):  [id number] - [place], [place] - [address] AT [facility] [address] [facility] Phone: [phone]  Fax: [phone]      Has the patient been seen for an appointment in the last year OR does the patient have an upcoming appointment? Yes.    Please assist patient further

## 2023-06-19 NOTE — Telephone Encounter (Signed)
Requested medication (s) are due for refill today: 05/22/23 #30  Requested medication (s) are on the active medication list: yes  Last refill:  05/22/23 #30  Future visit scheduled: yes  Notes to clinic:  med not delegated to NT to RF   Requested Prescriptions  Pending Prescriptions Disp Refills   CONCERTA 27 MG CR tablet 30 tablet 0    Sig: Take 1 tablet (27 mg total) by mouth daily.     Not Delegated - Psychiatry:  Stimulants/ADHD Failed - 06/18/2023 11:31 AM      Failed - This refill cannot be delegated      Failed - Urine Drug Screen completed in last 360 days      Failed - Valid encounter within last 6 months    Recent Outpatient Visits           7 months ago Primary hypertension   [facility] [place], Salvadore Oxford, NP   11 months ago Encounter for general adult medical examination with abnormal findings   [facility] [facility] [place], Salvadore Oxford, NP   1 year ago Primary hypertension   [facility] [place], Salvadore Oxford, NP   1 year ago Encounter for general adult medical examination with abnormal findings   [place] [facility] [place], Salvadore Oxford, NP   2 years ago Primary hypertension   [place] [facility] [place], Salvadore Oxford, NP       Future Appointments             In 1 week [place], Salvadore Oxford, NP [place] [facility], [facility]            Passed - Last BP in normal range    BP Readings from Last 1 Encounters:  10/29/22 116/72         Passed - Last Heart Rate in normal range    Pulse Readings from Last 1 Encounters:  10/29/22 87

## 2023-06-20 MED ORDER — CONCERTA 27 MG PO TBCR: 27.0000 mg | EXTENDED_RELEASE_TABLET | Freq: Every day | ORAL | 0 refills | Status: DC

## 2023-06-24 ENCOUNTER — Telehealth: Payer: Self-pay

## 2023-06-24 NOTE — Telephone Encounter (Signed)
Notification from [employer] stating concerta is not on formulary.  No alternative listed.  Patient was given a temporary supply.  To request coverage review you can call [employer] at [phone].  No reference number provided.  Please advise how to proceed.

## 2023-06-25 NOTE — Telephone Encounter (Signed)
Spoke with patients mother and she is going to contact insurance regarding alternative options.

## 2023-06-25 NOTE — Telephone Encounter (Signed)
Patient's mother needs to call insurance and see what is preferred

## 2023-07-01 ENCOUNTER — Ambulatory Visit: Payer: Medicare Other | Admitting: Internal Medicine

## 2023-07-01 ENCOUNTER — Encounter: Payer: Self-pay | Admitting: Internal Medicine

## 2023-07-01 ENCOUNTER — Telehealth: Payer: Self-pay | Admitting: Internal Medicine

## 2023-07-01 VITALS — BP 128/72 | Ht 69.0 in | Wt 165.0 lb

## 2023-07-01 DIAGNOSIS — R6250 Unspecified lack of expected normal physiological development in childhood: Secondary | ICD-10-CM | POA: Diagnosis not present

## 2023-07-01 DIAGNOSIS — Z23 Encounter for immunization: Secondary | ICD-10-CM

## 2023-07-01 DIAGNOSIS — I10 Essential (primary) hypertension: Secondary | ICD-10-CM | POA: Diagnosis not present

## 2023-07-01 DIAGNOSIS — G4701 Insomnia due to medical condition: Secondary | ICD-10-CM

## 2023-07-01 DIAGNOSIS — F902 Attention-deficit hyperactivity disorder, combined type: Secondary | ICD-10-CM

## 2023-07-01 DIAGNOSIS — E7800 Pure hypercholesterolemia, unspecified: Secondary | ICD-10-CM | POA: Diagnosis not present

## 2023-07-01 NOTE — Assessment & Plan Note (Addendum)
Continue Concerta 

## 2023-07-01 NOTE — Telephone Encounter (Signed)
Spoke with insurance and prior auth question requested.

## 2023-07-01 NOTE — Assessment & Plan Note (Signed)
Continue trazodone We will monitor

## 2023-07-01 NOTE — Assessment & Plan Note (Signed)
Will check lipid profile at next visit Encouraged low fat diet

## 2023-07-01 NOTE — Progress Notes (Signed)
Subjective:    Patient ID: Ian Lam, male    DOB: 09/13/1985, 37 y.o.   MRN: [id number]  HPI  Patient presents to clinic today for 62-month follow-up of chronic conditions.  HTN: His BP today is.  He is taking lisinopril as prescribed.  There is no ECG on file.  ADHD/developmental delay: Managed with concerta.  He does not follow with psychiatry.  HLD: His last LDL was 96, triglycerides 782, 10/2022.  He is not taking any cholesterol-lowering medication at this time.  He tries to consume a low-fat diet.  Insomnia: He has difficulty falling and staying asleep.  He is taking trazodone as prescribed.  There is no sleep study on file.  Review of Systems     Past Medical History:  Diagnosis Date   ADHD    Allergy    Fragile X syndrome in male    Hypertension     Current Outpatient Medications  Medication Sig Dispense Refill   CONCERTA 27 MG CR tablet Take 1 tablet (27 mg total) by mouth daily. 30 tablet 0   lisinopril (ZESTRIL) 20 MG tablet TAKE 1 TABLET BY MOUTH DAILY 90 tablet 1   loratadine (CLARITIN) 10 MG tablet Take 1 tablet (10 mg total) by mouth daily. Use for 4-6 weeks then stop, and use as needed or seasonally 90 tablet 1   traZODone (DESYREL) 50 MG tablet TAKE 1/2 TABLET BY MOUTH AT BEDTIME AS NEEDED FOR SLEEP 90 tablet 1   No current facility-administered medications for this visit.    No Known Allergies  Family History  Problem Relation Age of Onset   Fragile X syndrome Brother     Social History   Socioeconomic History   Marital status: Single    Spouse name: Not on file   Number of children: Not on file   Years of education: 12   Highest education level: Not on file  Occupational History   Not on file  Tobacco Use   Smoking status: Never   Smokeless tobacco: Never  Vaping Use   Vaping status: Never Used  Substance and Sexual Activity   Alcohol use: Never   Drug use: Never   Sexual activity: Not Currently  Other Topics Concern   Not on  file  Social History Narrative   Not on file   Social Determinants of Health   Financial Resource Strain: Not on file  Food Insecurity: Not on file  Transportation Needs: Not on file  Physical Activity: Not on file  Stress: Not on file  Social Connections: Not on file  Intimate Partner Violence: Not on file     Constitutional: Denies fever, malaise, fatigue, headache or abrupt weight changes.  HEENT: Denies eye pain, eye redness, ear pain, ringing in the ears, wax buildup, runny nose, nasal congestion, bloody nose, or sore throat. Respiratory: Denies difficulty breathing, shortness of breath, cough or sputum production.   Cardiovascular: Denies chest pain, chest tightness, palpitations or swelling in the hands or feet.  Gastrointestinal: Denies abdominal pain, bloating, constipation, diarrhea or blood in the stool.  GU: Denies urgency, frequency, pain with urination, burning sensation, blood in urine, odor or discharge. Musculoskeletal: Denies decrease in range of motion, difficulty with gait, muscle pain or joint pain and swelling.  Skin: Denies redness, rashes, lesions or ulcercations.  Neurological: Patient reports insomnia, difficulty concentrating.  Denies dizziness, difficulty with memory, difficulty with speech or problems with balance and coordination.  Psych: Denies anxiety, depression, SI/HI.  No other specific complaints  in a complete review of systems (except as listed in HPI above).  Objective:   Physical Exam   BP 128/72   Ht 5\' 9"  (1.753 m)   Wt 165 lb (74.8 kg)   BMI 24.37 kg/m   Wt Readings from Last 3 Encounters:  10/29/22 157 lb (71.2 kg)  07/05/22 159 lb (72.1 kg)  12/04/21 170 lb (77.1 kg)    General: Appears his stated age, well developed, well nourished in NAD. Skin: Warm, dry and intact. HEENT: Head: normal shape and size; Eyes: sclera white, no icterus, conjunctiva pink, PERRLA and EOMs intact;  Cardiovascular: Normal rate and rhythm. S1,S2  noted.  No murmur, rubs or gallops noted. No JVD or BLE edema. Pulmonary/Chest: Normal effort and positive vesicular breath sounds. No respiratory distress. No wheezes, rales or ronchi noted.  Musculoskeletal: No difficulty with gait.  Neurological: Alert. Psych: Mood and affect normal.  Behavior is normal.   BMET    Component Value Date/Time   NA 142 10/29/2022 0829   K 4.3 10/29/2022 0829   CL 106 10/29/2022 0829   CO2 28 10/29/2022 0829   GLUCOSE 81 10/29/2022 0829   BUN 22 10/29/2022 0829   CREATININE 1.13 10/29/2022 0829   CALCIUM 9.3 10/29/2022 0829   GFRNONAA 72 06/02/2020 0954   GFRAA 83 06/02/2020 0954    Lipid Panel     Component Value Date/Time   CHOL 153 10/29/2022 0829   TRIG 103 10/29/2022 0829   HDL 37 (L) 10/29/2022 0829   CHOLHDL 4.1 10/29/2022 0829   LDLCALC 96 10/29/2022 0829    CBC    Component Value Date/Time   WBC 4.2 12/04/2021 0930   RBC 5.15 12/04/2021 0930   HGB 14.9 12/04/2021 0930   HCT 43.8 12/04/2021 0930   PLT 185 12/04/2021 0930   MCV 85.0 12/04/2021 0930   MCH 28.9 12/04/2021 0930   MCHC 34.0 12/04/2021 0930   RDW 12.3 12/04/2021 0930   LYMPHSABS 1,717 06/02/2020 0954   EOSABS 19 06/02/2020 0954   BASOSABS 31 06/02/2020 0954    Hgb A1C Lab Results  Component Value Date   HGBA1C 5.1 12/04/2021           Assessment & Plan:      RTC in 6 months, follow-up chronic conditions Nicki Reaper, NP

## 2023-07-01 NOTE — Telephone Encounter (Signed)
Pt's mother called in says needs PA for, med , CONCERTA 27 MG CR tablet , its not covered under insurance. She gave the number to call to try to get authorized. [phone]

## 2023-07-01 NOTE — Telephone Encounter (Signed)
Ref #[id number]

## 2023-07-01 NOTE — Assessment & Plan Note (Signed)
Controlled on lisinopril Reinforced DASH diet

## 2023-07-01 NOTE — Assessment & Plan Note (Signed)
Continue Concerta 

## 2023-07-01 NOTE — Patient Instructions (Signed)

## 2023-07-08 NOTE — Telephone Encounter (Signed)
Methylphenidate HCL ER tablet 18 mg was denied. Other covered drugs include: Dextroamphentamine-amphetamine tablet (5 mg,7.5mg , 10 mg, 12.5mg , 15mg , 20mg , 30mg ), Guanfacine tablet (1 mg or 2mg ), Guanfacine tablet ER 24 hr (1mg , 2mg , 3mg , 4mg ), and Methylphenidate hcl tablet (5mg , 10mg , 20mg ).

## 2023-07-09 NOTE — Telephone Encounter (Signed)
Please let guardian know that methylphenidate was denied.  Would she be willing to switch to Intuniv?

## 2023-07-10 MED ORDER — GUANFACINE HCL ER 2 MG PO TB24: 2.0000 mg | ORAL_TABLET | Freq: Every day | ORAL | 0 refills | Status: DC

## 2023-07-10 NOTE — Telephone Encounter (Signed)
Intuniv sent to pharmacy

## 2023-07-10 NOTE — Addendum Note (Signed)
Addended by: Lorre Munroe on: 07/10/2023 08:00 AM   Modules accepted: Orders

## 2023-07-27 ENCOUNTER — Other Ambulatory Visit: Payer: Self-pay | Admitting: Internal Medicine

## 2023-07-30 ENCOUNTER — Other Ambulatory Visit: Payer: Self-pay

## 2023-07-30 MED ORDER — LORATADINE 10 MG PO TABS: 10.0000 mg | ORAL_TABLET | Freq: Every day | ORAL | 1 refills | Status: DC

## 2023-07-30 NOTE — Telephone Encounter (Signed)
Requested medication (s) are due for refill today: yes   Requested medication (s) are on the active medication list: yes   Last refill:  02/04/23 #90 1 refills  Future visit scheduled: yes in 4 months  Notes to clinic:  tapered drug. Do you want to continued refills?     Requested Prescriptions  Pending Prescriptions Disp Refills   loratadine (CLARITIN) 10 MG tablet [Pharmacy Med Name: ALLERGY RELF (LORATADINE) 10MG  TABS] 90 tablet 1    Sig: TAKE 1 TABLET BY MOUTH DAILY. USE FOR 4 TO 6 WEEKS THEN STOP, AND USE AS NEEDED OR SEASONALLY     Ear, Nose, and Throat:  Antihistamines 2 Passed - 07/27/2023 11:36 AM      Passed - Cr in normal range and within 360 days    Creat  Date Value Ref Range Status  10/29/2022 1.13 0.60 - 1.26 mg/dL Final         Passed - Valid encounter within last 12 months    Recent Outpatient Visits           4 weeks ago Primary hypertension   [place] [place], Salvadore Oxford, NP   9 months ago Primary hypertension   [facility] [place], Salvadore Oxford, NP   1 year ago Encounter for general adult medical examination with abnormal findings   [place] [facility] [place], Salvadore Oxford, NP   1 year ago Primary hypertension   [facility] [place], Salvadore Oxford, NP   2 years ago Encounter for general adult medical examination with abnormal findings   [place] [facility] [place], Salvadore Oxford, NP       Future Appointments             In 4 months Baity, Salvadore Oxford, NP [facility], [facility]

## 2023-07-31 ENCOUNTER — Telehealth: Payer: Self-pay | Admitting: Internal Medicine

## 2023-07-31 NOTE — Telephone Encounter (Signed)
Left message to call back to advise.

## 2023-07-31 NOTE — Telephone Encounter (Signed)
Medicare does not often pay for OTC medications.  She can see if she can buy it cheaper as a store brand OTC and not through the insurance

## 2023-07-31 NOTE — Telephone Encounter (Signed)
Ian Lam stated the pharmacy is charging $22.00 for the patient's allergy medication loratadine (CLARITIN) 10 MG tablet. Stated they are being told the insurance isn't covering it.  Pt's aunt seeking clinical advice. Stated this is too expensive; pt is on disability and Medicaid.

## 2023-08-01 NOTE — Telephone Encounter (Signed)
Patient's mother said that he has been taking this medication and insurance has been paying for it. Please advise.

## 2023-08-12 ENCOUNTER — Other Ambulatory Visit: Payer: Self-pay | Admitting: Internal Medicine

## 2023-08-12 MED ORDER — GUANFACINE HCL ER 2 MG PO TB24: 2.0000 mg | ORAL_TABLET | Freq: Every day | ORAL | 2 refills | Status: DC

## 2023-08-12 NOTE — Telephone Encounter (Signed)
Requested Prescriptions  Pending Prescriptions Disp Refills   guanFACINE (INTUNIV) 2 MG TB24 ER tablet 30 tablet 2    Sig: Take 1 tablet (2 mg total) by mouth daily.     Cardiovascular: Alpha-2 Agonists - guanfacine Passed - 08/12/2023  5:44 PM      Passed - Last BP in normal range    BP Readings from Last 1 Encounters:  07/01/23 128/72         Passed - Last Heart Rate in normal range    Pulse Readings from Last 1 Encounters:  10/29/22 87         Passed - Valid encounter within last 6 months    Recent Outpatient Visits           1 month ago Primary hypertension   [place] [place], Salvadore Oxford, NP   9 months ago Primary hypertension   [place] [facility] [place], Salvadore Oxford, NP   1 year ago Encounter for general adult medical examination with abnormal findings   [facility] [place], Salvadore Oxford, NP   1 year ago Primary hypertension   [place] [facility] [place], Salvadore Oxford, NP   2 years ago Encounter for general adult medical examination with abnormal findings   [facility] [place], Salvadore Oxford, NP       Future Appointments             In 4 months Baity, Salvadore Oxford, NP  [facility] [place], [facility]

## 2023-08-12 NOTE — Telephone Encounter (Signed)
Medication Refill -  Most Recent Primary Care Visit:  Provider: Lorre Munroe  Department: [facility]  Visit Type: OFFICE VISIT  Date: 07/01/2023  Medication: guanFACINE (INTUNIV) 2 MG TB24 ER tablet [08657]   Has the patient contacted their pharmacy? No (Agent: If no, request that the patient contact the pharmacy for the refill. If patient does not wish to contact the pharmacy document the reason why and proceed with request.) (Agent: If yes, when and what did the pharmacy advise?)  Is this the correct pharmacy for this prescription? Yes If no, delete pharmacy and type the correct one.  This is the patient's preferred pharmacy:  [id number] - [place], [place] - [address] AT [facility] OF [address] [place] [address] [place] [address] Phone: [phone] Fax: [phone]   Has the prescription been filled recently? Yes  Is the patient out of the medication? No  Has the patient been seen for an appointment in the last year OR does the patient have an upcoming appointment? Yes  Can we respond through MyChart? No  Agent: Please be advised that Rx refills may take up to 3 business days. We ask that you follow-up with your pharmacy.

## 2023-09-16 ENCOUNTER — Other Ambulatory Visit: Payer: Self-pay | Admitting: Internal Medicine

## 2023-09-16 DIAGNOSIS — I10 Essential (primary) hypertension: Secondary | ICD-10-CM

## 2023-09-16 NOTE — Telephone Encounter (Signed)
Requested medication (s) are due for refill today: yes  Requested medication (s) are on the active medication list: yes  Last refill:  03/25/23 #90 1 RF  Future visit scheduled: yes  Notes to clinic:  overdue lab work   Requested Prescriptions  Pending Prescriptions Disp Refills   lisinopril (ZESTRIL) 20 MG tablet [Pharmacy Med Name: LISINOPRIL 20MG  TABLETS] 90 tablet 1    Sig: TAKE 1 TABLET BY MOUTH DAILY     Cardiovascular:  ACE Inhibitors Failed - 09/16/2023  3:37 PM      Failed - Cr in normal range and within 180 days    Creat  Date Value Ref Range Status  10/29/2022 1.13 0.60 - 1.26 mg/dL Final         Failed - K in normal range and within 180 days    Potassium  Date Value Ref Range Status  10/29/2022 4.3 3.5 - 5.3 mmol/L Final         Passed - Patient is not pregnant      Passed - Last BP in normal range    BP Readings from Last 1 Encounters:  07/01/23 128/72         Passed - Valid encounter within last 6 months    Recent Outpatient Visits           2 months ago Primary hypertension   [place] [facility] [place], Salvadore Oxford, NP   10 months ago Primary hypertension   [place] [facility] [place], Salvadore Oxford, NP   1 year ago Encounter for general adult medical examination with abnormal findings   [place] [facility] [place], Salvadore Oxford, NP   1 year ago Primary hypertension   [place] [facility] [place], Salvadore Oxford, NP   2 years ago Encounter for general adult medical examination with abnormal findings   [place] [facility] [place], Salvadore Oxford, NP       Future Appointments             In 3 months Baity, Salvadore Oxford, NP [place] [facility], [facility]

## 2023-11-11 ENCOUNTER — Other Ambulatory Visit: Payer: Self-pay | Admitting: Internal Medicine

## 2023-11-13 NOTE — Telephone Encounter (Signed)
 Requested Prescriptions  Pending Prescriptions Disp Refills   guanFACINE (INTUNIV) 2 MG TB24 ER tablet [Pharmacy Med Name: GUANFACINE ER 2MG  TABLETS] 30 tablet 2    Sig: TAKE 1 TABLET(2 MG) BY MOUTH DAILY     Cardiovascular: Alpha-2 Agonists - guanfacine Passed - 11/13/2023  8:56 AM      Passed - Last BP in normal range    BP Readings from Last 1 Encounters:  07/01/23 128/72         Passed - Last Heart Rate in normal range    Pulse Readings from Last 1 Encounters:  10/29/22 87         Passed - Valid encounter within last 6 months    Recent Outpatient Visits           4 months ago Primary hypertension   [facility] [place], Salvadore Oxford, NP   1 year ago Primary hypertension   [facility] [place], Salvadore Oxford, NP   1 year ago Encounter for general adult medical examination with abnormal findings   [place] [facility] [place], Salvadore Oxford, NP   1 year ago Primary hypertension   [place] [facility] [place], Salvadore Oxford, NP   2 years ago Encounter for general adult medical examination with abnormal findings   [place] [facility] [place], Salvadore Oxford, NP       Future Appointments             In 1 month Baity, Salvadore Oxford, NP [place] [facility], [facility]

## 2023-11-25 ENCOUNTER — Other Ambulatory Visit: Payer: Self-pay | Admitting: Internal Medicine

## 2023-11-25 DIAGNOSIS — I10 Essential (primary) hypertension: Secondary | ICD-10-CM

## 2023-11-27 NOTE — Telephone Encounter (Signed)
 Requested Prescriptions  Pending Prescriptions Disp Refills   lisinopril (ZESTRIL) 20 MG tablet [Pharmacy Med Name: LISINOPRIL 20MG  TABLETS] 90 tablet 0    Sig: TAKE 1 TABLET BY MOUTH DAILY     Cardiovascular:  ACE Inhibitors Failed - 11/27/2023 12:28 PM      Failed - Cr in normal range and within 180 days    Creat  Date Value Ref Range Status  10/29/2022 1.13 0.60 - 1.26 mg/dL Final         Failed - K in normal range and within 180 days    Potassium  Date Value Ref Range Status  10/29/2022 4.3 3.5 - 5.3 mmol/L Final         Failed - Valid encounter within last 6 months    Recent Outpatient Visits   None     Future Appointments             In 3 weeks Baity, Salvadore Oxford, NP [place] [facility], [facility]            Passed - Patient is not pregnant      Passed - Last BP in normal range    BP Readings from Last 1 Encounters:  07/01/23 128/72

## 2023-12-13 ENCOUNTER — Other Ambulatory Visit: Payer: Self-pay | Admitting: Internal Medicine

## 2023-12-15 NOTE — Telephone Encounter (Signed)
 Requested Prescriptions  Pending Prescriptions Disp Refills   loratadine  (CLARITIN ) 10 MG tablet [Pharmacy Med Name: ALLERGY RELF (LORATADINE ) 10MG  TABS] 90 tablet 0    Sig: TAKE 1 TABLET BY MOUTH DAILY. USE FOR 4-6 WEEKS THEN STOP, AND USE AS NEEDED OR SEASONALLY     Ear, Nose, and Throat:  Antihistamines 2 Failed - 12/15/2023 11:54 AM      Failed - Cr in normal range and within 360 days    Creat  Date Value Ref Range Status  10/29/2022 1.13 0.60 - 1.26 mg/dL Final         Failed - Valid encounter within last 12 months    Recent Outpatient Visits   None     Future Appointments             In 1 month Baity, Rankin Buzzard, NP [place] [facility], [facility]

## 2023-12-19 ENCOUNTER — Ambulatory Visit: Payer: Self-pay | Admitting: Internal Medicine

## 2023-12-19 ENCOUNTER — Other Ambulatory Visit: Payer: Self-pay | Admitting: Internal Medicine

## 2023-12-19 NOTE — Telephone Encounter (Signed)
 loratadine  (CLARITIN ) 10 MG tablet 90 tablet 0 12/15/2023 --   Sig: TAKE 1 TABLET BY MOUTH DAILY. USE FOR 4-6 WEEKS THEN STOP, AND USE AS NEEDED OR SEASONALLY   Sent to pharmacy as: loratadine  (CLARITIN ) 10 MG tablet   E-Prescribing Status: Receipt confirmed by pharmacy (12/15/2023 11:54 AM EDT)    Requested Prescriptions  Refused Prescriptions Disp Refills   loratadine  (CLARITIN ) 10 MG tablet 90 tablet 0     Ear, Nose, and Throat:  Antihistamines 2 Failed - 12/19/2023  4:02 PM      Failed - Cr in normal range and within 360 days    Creat  Date Value Ref Range Status  10/29/2022 1.13 0.60 - 1.26 mg/dL Final         Failed - Valid encounter within last 12 months    Recent Outpatient Visits   None     Future Appointments             In 1 month Baity, Rankin Buzzard, NP [place] [facility] [place], [facility]

## 2023-12-19 NOTE — Telephone Encounter (Signed)
 Copied from CRM [id number]. Topic: Clinical - Medication Refill >> Dec 19, 2023 11:12 AM Ian Lam H wrote: Most Recent Primary Care Visit:  Provider: Carollynn Cirri  Department: [id number]  Visit Type: OFFICE VISIT  Date: 07/01/2023  Medication:loratadine  (CLARITIN ) 10 MG tablet [[id number]]   Has the patient contacted their pharmacy? No (Agent: If no, request that the patient contact the pharmacy for the refill. If patient does not wish to contact the pharmacy document the reason why and proceed with request.) (Agent: If yes, when and what did the pharmacy advise?)  Is this the correct pharmacy for this prescription? Yes If no, delete pharmacy and type the correct one.  This is the patient's preferred pharmacy:  [id number] - [place], [place] - [address] AT [facility] [address] [place] [address] [place] [address] Phone: [phone] Fax: [phone]   Has the prescription been filled recently? No  Is the patient out of the medication? Yes  Has the patient been seen for an appointment in the last year OR does the patient have an upcoming appointment? Yes  Can we respond through MyChart? No  Agent: Please be advised that Rx refills may take up to 3 business days. We ask that you follow-up with your pharmacy.

## 2023-12-23 ENCOUNTER — Ambulatory Visit: Payer: Self-pay

## 2023-12-23 NOTE — Telephone Encounter (Signed)
 Mother calling in stating insurance is not wanting to pay for the allergy medication, told it would cost $54. Mother states it should be covered.        Requesting assistance with this.  Forwarding to office for further advice and assistance.

## 2023-12-23 NOTE — Telephone Encounter (Signed)
 Called Ian Lam, notified her there is nothing we can do about the cost of medication. [facility]. She stated she will call the insurance company and find out the problem.

## 2024-01-14 ENCOUNTER — Other Ambulatory Visit: Payer: Self-pay | Admitting: Internal Medicine

## 2024-01-14 DIAGNOSIS — I10 Essential (primary) hypertension: Secondary | ICD-10-CM

## 2024-01-14 NOTE — Telephone Encounter (Unsigned)
 Copied from CRM [id number]. Topic: Clinical - Medication Refill >> Jan 14, 2024 10:40 AM Felizardo Hotter wrote: Medication: lisinopril  (ZESTRIL ) 20 MG tablet  Has the patient contacted their pharmacy? Yes (Agent: If no, request that the patient contact the pharmacy for the refill. If patient does not wish to contact the pharmacy document the reason why and proceed with request.) (Agent: If yes, when and what did the pharmacy advise?)  This is the patient's preferred pharmacy:  [facility][id number] [place], [place] - [address] AT [facility] OF [address] [place] [address] [place] [address] Phone: [phone] Fax: [phone]  Is this the correct pharmacy for this prescription? Yes If no, delete pharmacy and type the correct one.   Has the prescription been filled recently? Yes  Is the patient out of the medication? Yes  Has the patient been seen for an appointment in the last year OR does the patient have an upcoming appointment? Yes  Can we respond through MyChart? Yes  Agent: Please be advised that Rx refills may take up to 3 business days. We ask that you follow-up with your pharmacy.

## 2024-02-02 ENCOUNTER — Encounter: Payer: Self-pay | Admitting: Internal Medicine

## 2024-02-02 ENCOUNTER — Ambulatory Visit (INDEPENDENT_AMBULATORY_CARE_PROVIDER_SITE_OTHER): Admitting: Internal Medicine

## 2024-02-02 VITALS — BP 130/78 | Ht 69.0 in | Wt 180.0 lb

## 2024-02-02 DIAGNOSIS — E7800 Pure hypercholesterolemia, unspecified: Secondary | ICD-10-CM

## 2024-02-02 DIAGNOSIS — R6250 Unspecified lack of expected normal physiological development in childhood: Secondary | ICD-10-CM

## 2024-02-02 DIAGNOSIS — G4701 Insomnia due to medical condition: Secondary | ICD-10-CM

## 2024-02-02 DIAGNOSIS — I10 Essential (primary) hypertension: Secondary | ICD-10-CM

## 2024-02-02 DIAGNOSIS — F902 Attention-deficit hyperactivity disorder, combined type: Secondary | ICD-10-CM

## 2024-02-02 MED ORDER — LISINOPRIL 20 MG PO TABS: 20.0000 mg | ORAL_TABLET | Freq: Every day | ORAL | 1 refills | Status: DC

## 2024-02-02 MED ORDER — TRAZODONE HCL 50 MG PO TABS: 25.0000 mg | ORAL_TABLET | Freq: Every day | ORAL | 1 refills | Status: DC

## 2024-02-02 MED ORDER — GUANFACINE HCL ER 2 MG PO TB24: 2.0000 mg | ORAL_TABLET | Freq: Every day | ORAL | 2 refills | Status: DC

## 2024-02-02 MED ORDER — LORATADINE 10 MG PO TABS: 10.0000 mg | ORAL_TABLET | Freq: Every day | ORAL | 1 refills | Status: DC

## 2024-02-02 NOTE — Assessment & Plan Note (Signed)
 Continue intuniv  2 mg daily We will monitor

## 2024-02-02 NOTE — Patient Instructions (Signed)

## 2024-02-02 NOTE — Progress Notes (Signed)
 Subjective:    Patient ID: Ian Lam, male    DOB: May 18, 1986, 38 y.o.   MRN: [id number]  HPI  Patient presents to clinic today for 45-month follow-up of chronic conditions.  HTN: His BP today is 130/78.  He is taking lisinopril  as prescribed.  There is no ECG on file.  ADHD/developmental delay: Managed with intuniv .  He does not follow with psychiatry.  HLD: His last LDL was 96, triglycerides 478, 10/2022.  He is not taking any cholesterol-lowering medication at this time.  He tries to consume a low-fat diet.  Insomnia: He has difficulty falling and staying asleep.  He is taking trazodone  as prescribed.  There is no sleep study on file.  Review of Systems     Past Medical History:  Diagnosis Date   ADHD    Allergy    Fragile X syndrome in male    Hypertension     Current Outpatient Medications  Medication Sig Dispense Refill   CONCERTA  27 MG CR tablet Take 1 tablet (27 mg total) by mouth daily. 30 tablet 0   guanFACINE  (INTUNIV ) 2 MG TB24 ER tablet TAKE 1 TABLET(2 MG) BY MOUTH DAILY 30 tablet 2   lisinopril  (ZESTRIL ) 20 MG tablet TAKE 1 TABLET BY MOUTH DAILY 90 tablet 0   loratadine  (CLARITIN ) 10 MG tablet TAKE 1 TABLET BY MOUTH DAILY. USE FOR 4-6 WEEKS THEN STOP, AND USE AS NEEDED OR SEASONALLY 90 tablet 0   traZODone  (DESYREL ) 50 MG tablet TAKE 1/2 TABLET BY MOUTH AT BEDTIME AS NEEDED FOR SLEEP 90 tablet 1   No current facility-administered medications for this visit.    No Known Allergies  Family History  Problem Relation Age of Onset   Fragile X syndrome Brother     Social History   Socioeconomic History   Marital status: Single    Spouse name: Not on file   Number of children: Not on file   Years of education: 12   Highest education level: Not on file  Occupational History   Not on file  Tobacco Use   Smoking status: Never   Smokeless tobacco: Never  Vaping Use   Vaping status: Never Used  Substance and Sexual Activity   Alcohol use: Never    Drug use: Never   Sexual activity: Not Currently  Other Topics Concern   Not on file  Social History Narrative   Not on file   Social Drivers of Health   Financial Resource Strain: Not on file  Food Insecurity: Not on file  Transportation Needs: Not on file  Physical Activity: Not on file  Stress: Not on file  Social Connections: Not on file  Intimate Partner Violence: Not on file     Constitutional: Denies fever, malaise, fatigue, headache or abrupt weight changes.  HEENT: Denies eye pain, eye redness, ear pain, ringing in the ears, wax buildup, runny nose, nasal congestion, bloody nose, or sore throat. Respiratory: Denies difficulty breathing, shortness of breath, cough or sputum production.   Cardiovascular: Denies chest pain, chest tightness, palpitations or swelling in the hands or feet.  Gastrointestinal: Denies abdominal pain, bloating, constipation, diarrhea or blood in the stool.  GU: Denies urgency, frequency, pain with urination, burning sensation, blood in urine, odor or discharge. Musculoskeletal: Denies decrease in range of motion, difficulty with gait, muscle pain or joint pain and swelling.  Skin: Denies redness, rashes, lesions or ulcercations.  Neurological: Patient reports insomnia, difficulty concentrating.  Denies dizziness, difficulty with memory, difficulty with speech  or problems with balance and coordination.  Psych: Denies anxiety, depression, SI/HI.  No other specific complaints in a complete review of systems (except as listed in HPI above).  Objective:   Physical Exam  BP 130/78 (BP Location: Right Arm, Patient Position: Sitting, Cuff Size: Normal)   Ht 5\' 9"  (1.753 m)   Wt 180 lb (81.6 kg)   BMI 26.58 kg/m    Wt Readings from Last 3 Encounters:  07/01/23 165 lb (74.8 kg)  10/29/22 157 lb (71.2 kg)  07/05/22 159 lb (72.1 kg)    General: Appears his stated age, overweight, in NAD. Skin: Warm, dry and intact. HEENT: Head: normal shape and  size; Eyes: sclera white, no icterus, conjunctiva pink, PERRLA and EOMs intact;  Cardiovascular: Normal rate and rhythm. S1,S2 noted.  No murmur, rubs or gallops noted. No JVD or BLE edema. Pulmonary/Chest: Normal effort and positive vesicular breath sounds. No respiratory distress. No wheezes, rales or ronchi noted.  Musculoskeletal: No difficulty with gait.  Neurological: Alert. Psych: Mood and affect normal.  Behavior is normal.   BMET    Component Value Date/Time   NA 142 10/29/2022 0829   K 4.3 10/29/2022 0829   CL 106 10/29/2022 0829   CO2 28 10/29/2022 0829   GLUCOSE 81 10/29/2022 0829   BUN 22 10/29/2022 0829   CREATININE 1.13 10/29/2022 0829   CALCIUM 9.3 10/29/2022 0829   GFRNONAA 72 06/02/2020 0954   GFRAA 83 06/02/2020 0954    Lipid Panel     Component Value Date/Time   CHOL 153 10/29/2022 0829   TRIG 103 10/29/2022 0829   HDL 37 (L) 10/29/2022 0829   CHOLHDL 4.1 10/29/2022 0829   LDLCALC 96 10/29/2022 0829    CBC    Component Value Date/Time   WBC 4.2 12/04/2021 0930   RBC 5.15 12/04/2021 0930   HGB 14.9 12/04/2021 0930   HCT 43.8 12/04/2021 0930   PLT 185 12/04/2021 0930   MCV 85.0 12/04/2021 0930   MCH 28.9 12/04/2021 0930   MCHC 34.0 12/04/2021 0930   RDW 12.3 12/04/2021 0930   LYMPHSABS 1,717 06/02/2020 0954   EOSABS 19 06/02/2020 0954   BASOSABS 31 06/02/2020 0954    Hgb A1C Lab Results  Component Value Date   HGBA1C 5.1 12/04/2021           Assessment & Plan:      RTC in 6 months, follow-up chronic conditions Helayne Lo, NP

## 2024-02-02 NOTE — Assessment & Plan Note (Signed)
 Continue trazodone  25 mg at bedtime We will monitor

## 2024-02-02 NOTE — Assessment & Plan Note (Signed)
 Controlled on lisinopril  20 mg daily Reinforced DASH diet

## 2024-02-02 NOTE — Assessment & Plan Note (Signed)
 Encouraged low fat diet

## 2024-02-02 NOTE — Assessment & Plan Note (Addendum)
 Continue intuniv  2 mg daily We will monitor

## 2024-02-03 ENCOUNTER — Ambulatory Visit: Payer: Self-pay

## 2024-02-03 NOTE — Telephone Encounter (Signed)
 Copied from CRM [id number]. Topic: Clinical - Red Word Triage >> Feb 03, 2024  4:00 PM Sasha H wrote: Red Word that prompted transfer to Nurse Triage: pt hit hand on table and it has a knot on it but says it is not painful.    Reason for Disposition  Minor injury or bruising from direct blow    Appointment made per aunt's request  Answer Assessment - Initial Assessment Questions 1. MECHANISM: "How did the injury happen?"     Hit right hand on a table  2. ONSET: "When did the injury happen?" (Minutes or hours ago)      Yesterday 3. APPEARANCE of INJURY: "What does the injury look like?"      Knot on hand 4. SEVERITY: "Can you use the hand normally?" "Can you bend your fingers into a ball and then fully open them?"     Yes 5. SIZE: For cuts, bruises, or swelling, ask: "How large is it?" (e.g., inches or centimeters;  entire hand or wrist)      "Not too big" 6. PAIN: "Is there pain?" If Yes, ask: "How bad is the pain?"  (Scale 1-10; or mild, moderate, severe)     Denies pain 7. TETANUS: For any breaks in the skin, ask: "When was the last tetanus booster?"     N/A 8. OTHER SYMPTOMS: "Do you have any other symptoms?"      No  Protocols used: Hand and Wrist Injury-A-AH   FYI Only or Action Required?: FYI only for provider  Patient was last seen in primary care on 02/02/2024 by Carollynn Cirri, NP. Called Nurse Triage reporting Hand Injury. Symptoms began yesterday. Interventions attempted: Nothing. Symptoms are: unchanged.  Triage Disposition: Home Care  Patient/caregiver understands and will follow disposition?: Yes

## 2024-02-04 NOTE — Telephone Encounter (Signed)
 Will discuss at upcoming appointment

## 2024-02-05 ENCOUNTER — Ambulatory Visit (INDEPENDENT_AMBULATORY_CARE_PROVIDER_SITE_OTHER): Admitting: Internal Medicine

## 2024-02-05 ENCOUNTER — Encounter: Payer: Self-pay | Admitting: Internal Medicine

## 2024-02-05 ENCOUNTER — Ambulatory Visit
Admission: RE | Admit: 2024-02-05 | Discharge: 2024-02-05 | Disposition: A | Discharge status: Home / Self Care | Source: Ambulatory Visit | Attending: Internal Medicine | Admitting: Internal Medicine

## 2024-02-05 VITALS — BP 134/80 | Ht 69.0 in | Wt 181.2 lb

## 2024-02-05 DIAGNOSIS — M25431 Effusion, right wrist: Secondary | ICD-10-CM | POA: Diagnosis not present

## 2024-02-05 DIAGNOSIS — M25531 Pain in right wrist: Secondary | ICD-10-CM | POA: Diagnosis present

## 2024-02-05 NOTE — Patient Instructions (Signed)
 RICE Therapy for Routine Care of Injuries Many injuries can be cared for with rest, ice, compression, and elevation. This is also called RICE therapy. RICE therapy includes: Resting the injured body part. Putting ice on the injury. Putting pressure on the injury. This is also called compression. Raising the injured part. This is also called elevation. RICE therapy can help reduce pain and swelling. Supplies needed: Ice. Plastic bag. Towel. Elastic bandage. Pillow or pillows to raise the injured body part. How to care for your injury with RICE therapy Rest Try to rest the injured part of your body. You can go back to your normal activities when your health care provider says it's okay to do them and when you can do them without pain. Ask what things are safe for you to do. Some injuries heal better with early movement instead of resting. If you rest the injury too much, it may not heal as well. Ask your provider if you should do exercises to help your injury get better. Ice Putting ice on your injury can help to lessen swelling and pain. Do not apply ice directly to your skin. Use ice on as many days as told by your provider. If told, put ice on the area. Put ice in a plastic bag. Place a towel between your skin and the bag. Leave the ice on for 20 minutes, 2-3 times a day. If your skin turns bright red, take off the ice right away to prevent skin damage. The risk of damage is higher if you can't feel pain, heat, or cold.  Compression Put pressure, also called compression, on your injured area. This can be done with an elastic bandage. If this type of bandage has been put on your injury: Follow instructions on the package the bandage came in about how to use it. Do not wrap the bandage too tightly. Wrap the bandage more loosely if part of your body beyond the bandage looks blue, or is swollen, cold, painful, or loses feeling. Take off the bandage and put it on again every 3-4 hours or  as told by your provider. Call your provider if the bandage seems to make your injury worse.  Elevation Raise the injured area above the level of your heart while you're sitting or lying down. Use a pillow to support your injured area as needed. Follow these instructions at home: If your symptoms get worse or last a long time, make a follow-up appointment with your provider. You may need to have imaging tests, such as X-rays or an MRI. If you have imaging tests, ask how to get your results when they are ready. Contact a health care provider if: You keep having pain and swelling. Your symptoms get worse. Get help right away if: You have sudden, very bad pain at your injury or lower than your injury. You have tingling or numbness at your injury or lower than your injury, and it does not go away when you take the bandage off. This information is not intended to replace advice given to you by your health care provider. Make sure you discuss any questions you have with your health care provider. Document Revised: 05/15/2023 Document Reviewed: 10/28/2022 Elsevier Patient Education  2024 [employer].

## 2024-02-05 NOTE — Progress Notes (Signed)
 Subjective:    Patient ID: Ian Lam, male    DOB: 02-05-1986, 38 y.o.   MRN: [id number]  HPI  Patient presents to clinic today with complaint of right wrist pain and swelling.  He reports he hit it fairly hard on a table yesterday.  He subsequently noticed immediate swelling and discomfort.  He has not noticed any bruising.  He denies numbness, tingling or weakness of the right hand.  He has not applied ice or tried any medications OTC for this.  Review of Systems   Past Medical History:  Diagnosis Date   ADHD    Allergy    Fragile X syndrome in male    Hypertension     Current Outpatient Medications  Medication Sig Dispense Refill   guanFACINE  (INTUNIV ) 2 MG TB24 ER tablet Take 1 tablet (2 mg total) by mouth daily. 30 tablet 2   lisinopril  (ZESTRIL ) 20 MG tablet Take 1 tablet (20 mg total) by mouth daily. 90 tablet 1   loratadine  (CLARITIN ) 10 MG tablet Take 1 tablet (10 mg total) by mouth daily. 90 tablet 1   traZODone  (DESYREL ) 50 MG tablet Take 0.5 tablets (25 mg total) by mouth at bedtime. 45 tablet 1   No current facility-administered medications for this visit.    No Known Allergies  Family History  Problem Relation Age of Onset   Fragile X syndrome Brother     Social History   Socioeconomic History   Marital status: Single    Spouse name: Not on file   Number of children: Not on file   Years of education: 12   Highest education level: Not on file  Occupational History   Not on file  Tobacco Use   Smoking status: Never   Smokeless tobacco: Never  Vaping Use   Vaping status: Never Used  Substance and Sexual Activity   Alcohol use: Never   Drug use: Never   Sexual activity: Not Currently  Other Topics Concern   Not on file  Social History Narrative   Not on file   Social Drivers of Health   Financial Resource Strain: Not on file  Food Insecurity: Not on file  Transportation Needs: Not on file  Physical Activity: Not on file  Stress: Not  on file  Social Connections: Not on file  Intimate Partner Violence: Not on file     Constitutional: Denies fever, malaise, fatigue, headache or abrupt weight changes.  Respiratory: Denies difficulty breathing, shortness of breath, cough or sputum production.   Cardiovascular: Denies chest pain, chest tightness, palpitations or swelling in the hands or feet.  Musculoskeletal: Patient reports right wrist pain and swelling.  Denies decrease in range of motion, difficulty with gait, muscle pain .  Skin: Denies redness, rashes, lesions or ulcercations.  Neurological: Denies numbness, tingling or weakness of the right upper extremity.    No other specific complaints in a complete review of systems (except as listed in HPI above).      Objective:   Physical Exam  BP 134/80 (BP Location: Right Arm, Patient Position: Sitting, Cuff Size: Normal)   Ht 5' 9 (1.753 m)   Wt 181 lb 3.2 oz (82.2 kg)   BMI 26.76 kg/m   Wt Readings from Last 3 Encounters:  02/02/24 180 lb (81.6 kg)  07/01/23 165 lb (74.8 kg)  10/29/22 157 lb (71.2 kg)    General: Appears his stated age, well developed, well nourished in NAD. Skin: Warm, dry and intact. No bruising or  abrasions noted. Cardiovascular: Normal rate and rhythm.  Radial pulse 2+ on the right.\ Pulmonary/Chest: Normal effort and positive vesicular breath sounds. No respiratory distress. No wheezes, rales or ronchi noted.  Musculoskeletal: Normal flexion, extension and rotation of the right wrist.  Mild pain with localized swelling noted over the radiocarpal joint.  This area is nontender with palpation.  Handgrips equal.  No difficulty with gait.  Neurological: Alert and oriented.   BMET    Component Value Date/Time   NA 142 10/29/2022 0829   K 4.3 10/29/2022 0829   CL 106 10/29/2022 0829   CO2 28 10/29/2022 0829   GLUCOSE 81 10/29/2022 0829   BUN 22 10/29/2022 0829   CREATININE 1.13 10/29/2022 0829   CALCIUM 9.3 10/29/2022 0829    GFRNONAA 72 06/02/2020 0954   GFRAA 83 06/02/2020 0954    Lipid Panel     Component Value Date/Time   CHOL 153 10/29/2022 0829   TRIG 103 10/29/2022 0829   HDL 37 (L) 10/29/2022 0829   CHOLHDL 4.1 10/29/2022 0829   LDLCALC 96 10/29/2022 0829    CBC    Component Value Date/Time   WBC 4.2 12/04/2021 0930   RBC 5.15 12/04/2021 0930   HGB 14.9 12/04/2021 0930   HCT 43.8 12/04/2021 0930   PLT 185 12/04/2021 0930   MCV 85.0 12/04/2021 0930   MCH 28.9 12/04/2021 0930   MCHC 34.0 12/04/2021 0930   RDW 12.3 12/04/2021 0930   LYMPHSABS 1,717 06/02/2020 0954   EOSABS 19 06/02/2020 0954   BASOSABS 31 06/02/2020 0954    Hgb A1C Lab Results  Component Value Date   HGBA1C 5.1 12/04/2021            Assessment & Plan:  Right wrist pain and swelling:  Likely just localized inflammation and swelling Recommend ice for 10 minutes 2 times daily Okay to take ibuprofen 200 mg every 8 hours as needed if painful Will obtain x-ray right wrist today to rule out underlying fracture  RTC in 6 months for follow-up of chronic conditions Helayne Lo, NP

## 2024-02-06 ENCOUNTER — Ambulatory Visit: Payer: Self-pay | Admitting: Internal Medicine

## 2024-02-13 ENCOUNTER — Other Ambulatory Visit: Payer: Self-pay | Admitting: Internal Medicine

## 2024-02-17 NOTE — Telephone Encounter (Signed)
 Too soon refill, refill 02/02/24 for 30 and 1 RF.  Requested Prescriptions  Pending Prescriptions Disp Refills   loratadine  (CLARITIN ) 10 MG tablet [Pharmacy Med Name: ALLERGY RELF (LORATADINE ) 10MG  TABS] 90 tablet 1    Sig: TAKE 1 TABLET BY MOUTH EVERY DAY FOR 4-6 WEEKS AND USE AS NEEDED OR SEASONALLY     Ear, Nose, and Throat:  Antihistamines 2 Failed - 02/17/2024  9:48 AM      Failed - Cr in normal range and within 360 days    Creat  Date Value Ref Range Status  10/29/2022 1.13 0.60 - 1.26 mg/dL Final         Passed - Valid encounter within last 12 months    Recent Outpatient Visits           1 week ago Pain and swelling of right wrist   [facility] [place], Ang[place] ORN, NP   2 weeks ago Primary hypertension   [facility] [place], [place] W, NP               guanFACINE  (INTUNIV ) 2 MG TB24 ER tablet [Pharmacy Med Name: GUANFACINE  ER 2MG  TABLETS] 30 tablet 2    Sig: TAKE 1 TABLET(2 MG) BY MOUTH DAILY     Cardiovascular: Alpha-2 Agonists - guanfacine  Passed - 02/17/2024  9:48 AM      Passed - Last BP in normal range    BP Readings from Last 1 Encounters:  02/05/24 134/80         Passed - Last Heart Rate in normal range    Pulse Readings from Last 1 Encounters:  10/29/22 87         Passed - Valid encounter within last 6 months    Recent Outpatient Visits           1 week ago Pain and swelling of right wrist   [facility] [place], Angeline ORN, NP   2 weeks ago Primary hypertension    [facility] [place], Angeline ORN, [place]

## 2024-02-23 ENCOUNTER — Telehealth: Payer: Self-pay

## 2024-02-23 NOTE — Progress Notes (Signed)
   02/23/2024  Patient ID: Ian Lam, male   DOB: 07/28/1986, 38 y.o.   MRN: [id number]  Clinic routed request stating one of the patient's medications was recently not covered by insurance as it had been in the past.  Contacted Ian Lam (original caller requesting information), and she states loratadine  10mg  has been covered by patient's Medicare D plan in the past; but she had to pay for recent refill at the pharmacy.  Upon reviewing 2025 drug formulary, it does not appear loratadine  is covered (likely due to medication being OTC).  [employer], and they state loratadine  10mg  has not been covered by insurance.  They do state the patient's price can fluctuate month-to-month based on their cost of the medication.  Attempted to contact Ian Lam to make her aware of this and suggest looking into purchasing OTC via [employer] or [employer] if they have access to these options, as they tend to be more affordable.  I was not able to reach the patient, but I did leave a HIPAA compliant voicemail with my direct phone number.  Ian Lam, PharmD, DPLA

## 2024-02-23 NOTE — Telephone Encounter (Signed)
 Thank you :)

## 2024-02-23 NOTE — Telephone Encounter (Signed)
 Copied from CRM [id number]. Topic: General - Other >> Feb 23, 2024  9:58 AM Delon DASEN wrote: Reason for CRM: Patients prescription program did not pay for his medication- they said he needs to get signed up again- Jasyn Mey [phone]

## 2024-03-10 ENCOUNTER — Ambulatory Visit: Payer: Self-pay

## 2024-03-10 NOTE — Telephone Encounter (Signed)
 FYI Only or Action Required?: FYI only for provider.  Patient was last seen in primary care on 02/05/2024 by Antonette Angeline ORN, NP.  Called Nurse Triage reporting Rash and Sore.  Symptoms began unknown, seen today.  Interventions attempted: Nothing.  Symptoms are: stable.  Triage Disposition: See Physician Within 24 Hours  Patient/caregiver understands and will follow disposition?: YesCopied from CRM #[id number]. Topic: Clinical - Medication Question >> Mar 10, 2024  4:52 PM Mia F wrote: Reason for CRM: Pt mom is calling and saying that pt has a bump that can possibly be a rash on his head that developed this morning. She said that the local pharmacy told her to put an ointment on it but she wants to know if that is ok to do so. Reason for Disposition  [1] Unexplained sores AND [2] 3 or more  Answer Assessment - Initial Assessment Questions Patient's aunt called, no DPR, advised I will need to speak to him to give permission to speak to her. She says he's handicap and wouldn't be able to give me his name/DOB or permission. She says he barely talks to anyone and she is always bringing him to the office and speaking for him. Advised to fill out a Release of Information in the office when she goes to give us  permission, she says she will. She reports a sore on top of the patient's head, unknown how long it's been there, saw it today after his haircut. Advised OV, first available on Monday 03/15/24, she agreed. She says she may take him to the UC tomorrow so that she can know what this is. Advised to use antibiotic ointment a few times a day. Advised if she does to still keep appointment on Monday for follow up, she agrees.   1. APPEARANCE of SORES: What do the sores look like?     Looks like a sore with the scab off of it 2. NUMBER: How many sores are there?     1 3. SIZE: How big is the largest sore?     Tiny like tip of pen 4. LOCATION: Where are the sores located?     On the top of  head 5. ONSET: When did the sores begin?     Not sure, just saw it this morning 6. TENDER: Does it hurt when you touch it?  (Scale 1-10; or mild, moderate, severe)      Denies pain 7. CAUSE: What do you think is causing the sores?     Not sure 8. OTHER SYMPTOMS: Do you have any other symptoms? (e.g., fever, new weakness)     No  Protocols used: Sores-A-AH

## 2024-03-11 NOTE — Telephone Encounter (Signed)
 Agree with advice given, will discuss further at appointment on Monday

## 2024-03-15 ENCOUNTER — Ambulatory Visit: Admitting: Internal Medicine

## 2024-05-13 ENCOUNTER — Other Ambulatory Visit: Payer: Self-pay | Admitting: Internal Medicine

## 2024-05-13 DIAGNOSIS — I10 Essential (primary) hypertension: Secondary | ICD-10-CM

## 2024-05-14 NOTE — Telephone Encounter (Signed)
 Requested Prescriptions  Pending Prescriptions Disp Refills   guanFACINE  (INTUNIV ) 2 MG TB24 ER tablet [Pharmacy Med Name: GUANFACINE  ER 2MG  TABLETS] 30 tablet 2    Sig: TAKE 1 TABLET(2 MG) BY MOUTH DAILY     Cardiovascular: Alpha-2 Agonists - guanfacine  Passed - 05/14/2024 12:04 PM      Passed - Last BP in normal range    BP Readings from Last 1 Encounters:  02/05/24 134/80         Passed - Last Heart Rate in normal range    Pulse Readings from Last 1 Encounters:  10/29/22 87         Passed - Valid encounter within last 6 months    Recent Outpatient Visits           3 months ago Pain and swelling of right wrist   [place] [facility] [place], Angeline ORN, NP   3 months ago Primary hypertension   [place] [facility] [place], Angeline ORN, NP              Refused Prescriptions Disp Refills   lisinopril  (ZESTRIL ) 20 MG tablet [Pharmacy Med Name: LISINOPRIL  20MG  TABLETS] 90 tablet 1    Sig: TAKE 1 TABLET BY MOUTH DAILY     Cardiovascular:  ACE Inhibitors Failed - 05/14/2024 12:04 PM      Failed - Cr in normal range and within 180 days    Creat  Date Value Ref Range Status  10/29/2022 1.13 0.60 - 1.26 mg/dL Final         Failed - K in normal range and within 180 days    Potassium  Date Value Ref Range Status  10/29/2022 4.3 3.5 - 5.3 mmol/L Final         Passed - Patient is not pregnant      Passed - Last BP in normal range    BP Readings from Last 1 Encounters:  02/05/24 134/80         Passed - Valid encounter within last 6 months    Recent Outpatient Visits           3 months ago Pain and swelling of right wrist   [place] [facility] [place], Angeline ORN, NP   3 months ago Primary hypertension   [place] [facility] [place], Angeline ORN, [place]

## 2024-06-16 ENCOUNTER — Other Ambulatory Visit: Payer: Self-pay | Admitting: Internal Medicine

## 2024-06-16 DIAGNOSIS — I10 Essential (primary) hypertension: Secondary | ICD-10-CM

## 2024-06-18 NOTE — Telephone Encounter (Signed)
 Requested medication (s) are due for refill today: Yes  Requested medication (s) are on the active medication list: Yes  Last refill:  02/02/24  Future visit scheduled: Yes  Notes to clinic:  Unable to refill per protocol due to failed labs, no updated results.      Requested Prescriptions  Pending Prescriptions Disp Refills   lisinopril  (ZESTRIL ) 20 MG tablet [Pharmacy Med Name: LISINOPRIL  20MG  TABLETS] 90 tablet 1    Sig: TAKE 1 TABLET(20 MG) BY MOUTH DAILY     Cardiovascular:  ACE Inhibitors Failed - 06/18/2024 10:04 AM      Failed - Cr in normal range and within 180 days    Creat  Date Value Ref Range Status  10/29/2022 1.13 0.60 - 1.26 mg/dL Final         Failed - K in normal range and within 180 days    Potassium  Date Value Ref Range Status  10/29/2022 4.3 3.5 - 5.3 mmol/L Final         Passed - Patient is not pregnant      Passed - Last BP in normal range    BP Readings from Last 1 Encounters:  02/05/24 134/80         Passed - Valid encounter within last 6 months    Recent Outpatient Visits           4 months ago Pain and swelling of right wrist   [place] [facility] [place], Angeline ORN, NP   4 months ago Primary hypertension   [place] [facility] [place], [place], NP              Signed Prescriptions Disp Refills   traZODone  (DESYREL ) 50 MG tablet 45 tablet 1    Sig: TAKE 1/2 TABLET(25 MG) BY MOUTH AT BEDTIME     Psychiatry: Antidepressants - Serotonin Modulator Passed - 06/18/2024 10:04 AM      Passed - Valid encounter within last 6 months    Recent Outpatient Visits           4 months ago Pain and swelling of right wrist   [place] [facility] [place], Angeline ORN, NP   4 months ago Primary hypertension    [place] [facility] [place], Angeline ORN, [place]

## 2024-07-06 ENCOUNTER — Encounter: Payer: Self-pay | Admitting: Internal Medicine

## 2024-07-06 ENCOUNTER — Ambulatory Visit: Admitting: Internal Medicine

## 2024-07-06 VITALS — BP 130/70 | Ht 69.0 in | Wt 186.0 lb

## 2024-07-06 DIAGNOSIS — Z23 Encounter for immunization: Secondary | ICD-10-CM

## 2024-07-06 DIAGNOSIS — R739 Hyperglycemia, unspecified: Secondary | ICD-10-CM

## 2024-07-06 DIAGNOSIS — F902 Attention-deficit hyperactivity disorder, combined type: Secondary | ICD-10-CM | POA: Diagnosis not present

## 2024-07-06 DIAGNOSIS — E663 Overweight: Secondary | ICD-10-CM | POA: Insufficient documentation

## 2024-07-06 DIAGNOSIS — G4701 Insomnia due to medical condition: Secondary | ICD-10-CM

## 2024-07-06 DIAGNOSIS — I10 Essential (primary) hypertension: Secondary | ICD-10-CM | POA: Diagnosis not present

## 2024-07-06 DIAGNOSIS — E7800 Pure hypercholesterolemia, unspecified: Secondary | ICD-10-CM

## 2024-07-06 DIAGNOSIS — R6250 Unspecified lack of expected normal physiological development in childhood: Secondary | ICD-10-CM

## 2024-07-06 DIAGNOSIS — Z6827 Body mass index (BMI) 27.0-27.9, adult: Secondary | ICD-10-CM

## 2024-07-06 MED ORDER — ATOMOXETINE HCL 60 MG PO CAPS: 60.0000 mg | ORAL_CAPSULE | Freq: Every day | ORAL | 0 refills | Status: DC

## 2024-07-06 NOTE — Assessment & Plan Note (Signed)
C-Met and lipid profile today Encouraged low-fat diet

## 2024-07-06 NOTE — Patient Instructions (Signed)

## 2024-07-06 NOTE — Assessment & Plan Note (Signed)
 Intuniv  2 mg daily ineffective, will change to strattera 60 mg daily We will monitor

## 2024-07-06 NOTE — Assessment & Plan Note (Signed)
 Continue trazodone  25 mg at bedtime We will monitor

## 2024-07-06 NOTE — Assessment & Plan Note (Signed)
 Encouraged diet and exercise for weight loss ?

## 2024-07-06 NOTE — Assessment & Plan Note (Signed)
 Controlled on lisinopril  20 mg daily Reinforced DASH diet

## 2024-07-06 NOTE — Progress Notes (Signed)
 Subjective:    Patient ID: Ian Lam, male    DOB: Sep 25, 1985, 38 y.o.   MRN: [id number]  HPI  Patient presents to clinic today for 70-month follow-up of chronic conditions.  HTN: His BP today is 130/78.  He is taking lisinopril  as prescribed.  There is no ECG on file.  ADHD/developmental delay: Managed with intuniv .  His caregiver reports his behaviors have become uncontrollable lately and does not feel like the medication is effective.  He does not follow with psychiatry.  HLD: His last LDL was 96, triglycerides 896, 10/2022.  He is not taking any cholesterol-lowering medication at this time.  He tries to consume a low-fat diet.  Insomnia: He has difficulty falling and staying asleep.  He is taking trazodone  as prescribed.  There is no sleep study on file.  Review of Systems     Past Medical History:  Diagnosis Date   ADHD    Allergy    Fragile X syndrome in male    Hypertension     Current Outpatient Medications  Medication Sig Dispense Refill   guanFACINE  (INTUNIV ) 2 MG TB24 ER tablet TAKE 1 TABLET(2 MG) BY MOUTH DAILY 30 tablet 2   lisinopril  (ZESTRIL ) 20 MG tablet Take 1 tablet (20 mg total) by mouth daily. 90 tablet 1   loratadine  (CLARITIN ) 10 MG tablet Take 1 tablet (10 mg total) by mouth daily. 90 tablet 1   traZODone  (DESYREL ) 50 MG tablet TAKE 1/2 TABLET(25 MG) BY MOUTH AT BEDTIME 45 tablet 1   No current facility-administered medications for this visit.    No Known Allergies  Family History  Problem Relation Age of Onset   Fragile X syndrome Brother     Social History   Socioeconomic History   Marital status: Single    Spouse name: Not on file   Number of children: Not on file   Years of education: 12   Highest education level: Not on file  Occupational History   Not on file  Tobacco Use   Smoking status: Never   Smokeless tobacco: Never  Vaping Use   Vaping status: Never Used  Substance and Sexual Activity   Alcohol use: Never   Drug  use: Never   Sexual activity: Not Currently  Other Topics Concern   Not on file  Social History Narrative   Not on file   Social Drivers of Health   Financial Resource Strain: Not on file  Food Insecurity: Not on file  Transportation Needs: Not on file  Physical Activity: Not on file  Stress: Not on file  Social Connections: Not on file  Intimate Partner Violence: Not on file     Constitutional: Denies fever, malaise, fatigue, headache or abrupt weight changes.  HEENT: Denies eye pain, eye redness, ear pain, ringing in the ears, wax buildup, runny nose, nasal congestion, bloody nose, or sore throat. Respiratory: Denies difficulty breathing, shortness of breath, cough or sputum production.   Cardiovascular: Denies chest pain, chest tightness, palpitations or swelling in the hands or feet.  Gastrointestinal: Denies abdominal pain, bloating, constipation, diarrhea or blood in the stool.  GU: Denies urgency, frequency, pain with urination, burning sensation, blood in urine, odor or discharge. Musculoskeletal: Denies decrease in range of motion, difficulty with gait, muscle pain or joint pain and swelling.  Skin: Denies redness, rashes, lesions or ulcercations.  Neurological: Patient reports insomnia, difficulty concentrating.  Denies dizziness, difficulty with memory, difficulty with speech or problems with balance and coordination.  Psych: Denies  anxiety, depression, SI/HI.  No other specific complaints in a complete review of systems (except as listed in HPI above).  Objective:   Physical Exam  BP 130/70 (BP Location: Right Arm, Patient Position: Sitting, Cuff Size: Normal)   Ht 5' 9 (1.753 m)   Wt 186 lb (84.4 kg)   BMI 27.47 kg/m     Wt Readings from Last 3 Encounters:  02/05/24 181 lb 3.2 oz (82.2 kg)  02/02/24 180 lb (81.6 kg)  07/01/23 165 lb (74.8 kg)    General: Appears his stated age, overweight, in NAD. Skin: Warm, dry and intact. HEENT: Head: normal shape  and size; Eyes: sclera white, no icterus, conjunctiva pink, PERRLA and EOMs intact;  Cardiovascular: Normal rate and rhythm. S1,S2 noted.  No murmur, rubs or gallops noted. No JVD or BLE edema. Pulmonary/Chest: Normal effort and positive vesicular breath sounds. No respiratory distress. No wheezes, rales or ronchi noted.  Musculoskeletal: No difficulty with gait.  Neurological: Alert. Psych: Mood and affect normal.  Behavior is normal.   BMET    Component Value Date/Time   NA 142 10/29/2022 0829   K 4.3 10/29/2022 0829   CL 106 10/29/2022 0829   CO2 28 10/29/2022 0829   GLUCOSE 81 10/29/2022 0829   BUN 22 10/29/2022 0829   CREATININE 1.13 10/29/2022 0829   CALCIUM 9.3 10/29/2022 0829   GFRNONAA 72 06/02/2020 0954   GFRAA 83 06/02/2020 0954    Lipid Panel     Component Value Date/Time   CHOL 153 10/29/2022 0829   TRIG 103 10/29/2022 0829   HDL 37 (L) 10/29/2022 0829   CHOLHDL 4.1 10/29/2022 0829   LDLCALC 96 10/29/2022 0829    CBC    Component Value Date/Time   WBC 4.2 12/04/2021 0930   RBC 5.15 12/04/2021 0930   HGB 14.9 12/04/2021 0930   HCT 43.8 12/04/2021 0930   PLT 185 12/04/2021 0930   MCV 85.0 12/04/2021 0930   MCH 28.9 12/04/2021 0930   MCHC 34.0 12/04/2021 0930   RDW 12.3 12/04/2021 0930   LYMPHSABS 1,717 06/02/2020 0954   EOSABS 19 06/02/2020 0954   BASOSABS 31 06/02/2020 0954    Hgb A1C Lab Results  Component Value Date   HGBA1C 5.1 12/04/2021           Assessment & Plan:      RTC in 6 months, follow-up chronic conditions Angeline Laura, NP

## 2024-07-07 ENCOUNTER — Ambulatory Visit: Payer: Self-pay | Admitting: Internal Medicine

## 2024-07-07 LAB — CBC
HCT: 44.9 % (ref 38.5–50.0)
Hemoglobin: 14.8 g/dL (ref 13.2–17.1)
MCH: 28.6 pg (ref 27.0–33.0)
MCHC: 33 g/dL (ref 32.0–36.0)
MCV: 86.7 fL (ref 80.0–100.0)
MPV: 10.4 fL (ref 7.5–12.5)
Platelets: 237 Thousand/uL (ref 140–400)
RBC: 5.18 Million/uL (ref 4.20–5.80)
RDW: 12.3 % (ref 11.0–15.0)
WBC: 5.3 Thousand/uL (ref 3.8–10.8)

## 2024-07-07 LAB — COMPREHENSIVE METABOLIC PANEL WITH GFR
AG Ratio: 2.3 (calc) (ref 1.0–2.5)
ALT: 14 U/L (ref 9–46)
AST: 11 U/L (ref 10–40)
Albumin: 4.6 g/dL (ref 3.6–5.1)
Alkaline phosphatase (APISO): 55 U/L (ref 36–130)
BUN: 23 mg/dL (ref 7–25)
CO2: 27 mmol/L (ref 20–32)
Calcium: 9.4 mg/dL (ref 8.6–10.3)
Chloride: 106 mmol/L (ref 98–110)
Creat: 1.16 mg/dL (ref 0.60–1.26)
Globulin: 2 g/dL (ref 1.9–3.7)
Glucose, Bld: 98 mg/dL (ref 65–99)
Potassium: 4.1 mmol/L (ref 3.5–5.3)
Sodium: 142 mmol/L (ref 135–146)
Total Bilirubin: 0.6 mg/dL (ref 0.2–1.2)
Total Protein: 6.6 g/dL (ref 6.1–8.1)
eGFR: 83 mL/min/1.73m2 (ref >=60)

## 2024-07-07 LAB — HEMOGLOBIN A1C
Hgb A1c MFr Bld: 5.4 % (ref <5.7)
Mean Plasma Glucose: 108 mg/dL
eAG (mmol/L): 6 mmol/L

## 2024-07-07 LAB — LIPID PANEL
Cholesterol: 146 mg/dL (ref <200)
HDL: 36 mg/dL — ABNORMAL LOW (ref >=40)
LDL Cholesterol (Calc): 91 mg/dL
Non-HDL Cholesterol (Calc): 110 mg/dL (ref <130)
Total CHOL/HDL Ratio: 4.1 (calc) (ref <5.0)
Triglycerides: 99 mg/dL (ref <150)

## 2024-07-26 ENCOUNTER — Telehealth: Payer: Self-pay | Admitting: Internal Medicine

## 2024-07-26 NOTE — Telephone Encounter (Unsigned)
 Copied from CRM #[id number]. Topic: Clinical - Medication Refill >> Jul 26, 2024  1:03 PM Ameerah G wrote: Medication: loratadine  (CLARITIN ) 10 MG tablet [[id number]]  Has the patient contacted their pharmacy? Yes (Agent: If no, request that the patient contact the pharmacy for the refill. If patient does not wish to contact the pharmacy document the reason why and proceed with request.) (Agent: If yes, when and what did the pharmacy advise?)  This is the patient's preferred pharmacy:  [id number] [place], [place] - [address] AT [facility] OF [address] [place] [address] [place] [address] Phone: [phone] Fax: [phone]  Is this the correct pharmacy for this prescription? Yes If no, delete pharmacy and type the correct one.   Has the prescription been filled recently? No  Is the patient out of the medication? Yes  Has the patient been seen for an appointment in the last year OR does the patient have an upcoming appointment? Yes  Can we respond through MyChart? No  Agent: Please be advised that Rx refills may take up to 3 business days. We ask that you follow-up with your pharmacy.

## 2024-07-29 MED ORDER — LORATADINE 10 MG PO TABS: 10.0000 mg | ORAL_TABLET | Freq: Every day | ORAL | 1 refills | Status: AC

## 2024-07-29 NOTE — Telephone Encounter (Signed)
 Requested Prescriptions  Pending Prescriptions Disp Refills   loratadine  (CLARITIN ) 10 MG tablet 90 tablet 1    Sig: Take 1 tablet (10 mg total) by mouth daily.     Ear, Nose, and Throat:  Antihistamines 2 Passed - 07/29/2024  1:56 PM      Passed - Cr in normal range and within 360 days    Creat  Date Value Ref Range Status  07/06/2024 1.16 0.60 - 1.26 mg/dL Final         Passed - Valid encounter within last 12 months    Recent Outpatient Visits           3 weeks ago Influenza vaccine administered   [place] [facility] [place], Angeline ORN, NP   5 months ago Pain and swelling of right wrist   [place] [facility] [place], Angeline ORN, NP   5 months ago Primary hypertension   [place] [facility] [place], Angeline ORN, [place]

## 2024-08-17 ENCOUNTER — Other Ambulatory Visit: Payer: Self-pay | Admitting: Internal Medicine

## 2024-08-17 NOTE — Telephone Encounter (Unsigned)
 Copied from CRM #[id number]. Topic: Clinical - Medication Refill >> Aug 17, 2024  8:44 AM Tiffini S wrote: Medication: atomoxetine  (STRATTERA ) 60 MG capsule  Has the patient contacted their pharmacy? No (Agent: If no, request that the patient contact the pharmacy for the refill. If patient does not wish to contact the pharmacy document the reason why and proceed with request.) (Agent: If yes, when and what did the pharmacy advise?)  This is the patient's preferred pharmacy:  [id number] [place], [place] - [address] AT [facility] OF [address] [place] [address] [place] [address] Phone: [phone] Fax: [phone]  Is this the correct pharmacy for this prescription? Yes If no, delete pharmacy and type the correct one.   Has the prescription been filled recently? Yes  Is the patient out of the medication? Yes  Has the patient been seen for an appointment in the last year OR does the patient have an upcoming appointment? Yes  Can we respond through MyChart? No, please call [phone]  Agent: Please be advised that Rx refills may take up to 3 business days. We ask that you follow-up with your pharmacy.

## 2024-08-18 NOTE — Telephone Encounter (Signed)
 Requested medication (s) are due for refill today: Yes  Requested medication (s) are on the active medication list: Yes  Last refill:  07/06/24  Future visit scheduled: Yes  Notes to clinic:  Unable to refill per protocol, cannot delegate.     Requested Prescriptions  Pending Prescriptions Disp Refills   atomoxetine  (STRATTERA ) 60 MG capsule 30 capsule 0    Sig: Take 1 capsule (60 mg total) by mouth daily.     Not Delegated - Psychiatry: Norepinephrine Reuptake Inhibitor Failed - 08/18/2024  1:04 PM      Failed - This refill cannot be delegated      Passed - Completed PHQ-2 or PHQ-9 in the last 360 days      Passed - Last BP in normal range    BP Readings from Last 1 Encounters:  07/06/24 130/70         Passed - Last Heart Rate in normal range    Pulse Readings from Last 1 Encounters:  10/29/22 87         Passed - Valid encounter within last 6 months    Recent Outpatient Visits           1 month ago Influenza vaccine administered   [place] [facility] [place], Angeline ORN, NP   6 months ago Pain and swelling of right wrist   [place] [facility] [place], Angeline ORN, NP   6 months ago Primary hypertension   [place] [facility] [place], Angeline ORN, [place]

## 2024-08-20 MED ORDER — ATOMOXETINE HCL 60 MG PO CAPS: 60.0000 mg | ORAL_CAPSULE | Freq: Every day | ORAL | 0 refills | Status: DC

## 2024-09-20 ENCOUNTER — Other Ambulatory Visit: Payer: Self-pay | Admitting: Internal Medicine

## 2024-09-20 DIAGNOSIS — I10 Essential (primary) hypertension: Secondary | ICD-10-CM

## 2024-09-20 NOTE — Telephone Encounter (Unsigned)
 Copied from CRM #[id number]. Topic: Clinical - Medication Refill >> Sep 20, 2024 11:02 AM Montie POUR wrote: Medication:  atomoxetine  (STRATTERA ) 60 MG capsule  lisinopril  (ZESTRIL ) 20 MG tablet   Has the patient contacted their pharmacy? Yes (Agent: If no, request that the patient contact the pharmacy for the refill. If patient does not wish to contact the pharmacy document the reason why and proceed with request.) (Agent: If yes, when and what did the pharmacy advise?) Pharmacy needs order to refill  This is the patient's preferred pharmacy:  [facility][id number] [place], [place] - [address] AT [facility] OF [address] [place] [address] [place] [address] Phone: [phone] Fax: [phone]  Is this the correct pharmacy for this prescription? Yes If no, delete pharmacy and type the correct one.   Has the prescription been filled recently? No  Is the patient out of the medication? No  Has the patient been seen for an appointment in the last year OR does the patient have an upcoming appointment? Yes  Can we respond through MyChart? No  Agent: Please be advised that Rx refills may take up to 3 business days. We ask that you follow-up with your pharmacy.

## 2024-09-21 MED ORDER — LISINOPRIL 20 MG PO TABS: 20.0000 mg | ORAL_TABLET | Freq: Every day | ORAL | 0 refills | Status: AC

## 2024-09-21 MED ORDER — ATOMOXETINE HCL 60 MG PO CAPS: 60.0000 mg | ORAL_CAPSULE | Freq: Every day | ORAL | 0 refills | Status: AC

## 2024-09-21 NOTE — Telephone Encounter (Signed)
 Requested medication (s) are due for refill today -yes  Requested medication (s) are on the active medication list -yes  Future visit scheduled -yes  Last refill: 08/20/24 #30  Notes to clinic: non delegated Rx  Requested Prescriptions  Pending Prescriptions Disp Refills   atomoxetine  (STRATTERA ) 60 MG capsule 30 capsule 0    Sig: Take 1 capsule (60 mg total) by mouth daily.     Not Delegated - Psychiatry: Norepinephrine Reuptake Inhibitor Failed - 09/21/2024  2:06 PM      Failed - This refill cannot be delegated      Passed - Completed PHQ-2 or PHQ-9 in the last 360 days      Passed - Last BP in normal range    BP Readings from Last 1 Encounters:  07/06/24 130/70         Passed - Last Heart Rate in normal range    Pulse Readings from Last 1 Encounters:  10/29/22 87         Passed - Valid encounter within last 6 months    Recent Outpatient Visits           2 months ago Influenza vaccine administered   [place] [facility] [place], [place], NP   7 months ago Pain and swelling of right wrist   [place] [facility] [place], Angeline ORN, NP   7 months ago Primary hypertension   [place] [facility] [place], Angeline ORN, NP              Signed Prescriptions Disp Refills   lisinopril  (ZESTRIL ) 20 MG tablet 90 tablet 0    Sig: Take 1 tablet (20 mg total) by mouth daily.     Cardiovascular:  ACE Inhibitors Passed - 09/21/2024  2:06 PM      Passed - Cr in normal range and within 180 days    Creat  Date Value Ref Range Status  07/06/2024 1.16 0.60 - 1.26 mg/dL Final         Passed - K in normal range and within 180 days    Potassium  Date Value Ref Range Status  07/06/2024 4.1 3.5 - 5.3 mmol/L Final         Passed - Patient is not pregnant      Passed - Last BP in normal range    BP Readings from Last 1 Encounters:  07/06/24 130/70         Passed - Valid encounter within last 6 months    Recent Outpatient  Visits           2 months ago Influenza vaccine administered   [place] [facility] [place], Angeline ORN, NP   7 months ago Pain and swelling of right wrist   [place] [facility] [place], Angeline ORN, NP   7 months ago Primary hypertension   [place] [facility] [place], Angeline ORN, NP                 Requested Prescriptions  Pending Prescriptions Disp Refills   atomoxetine  (STRATTERA ) 60 MG capsule 30 capsule 0    Sig: Take 1 capsule (60 mg total) by mouth daily.     Not Delegated - Psychiatry: Norepinephrine Reuptake Inhibitor Failed - 09/21/2024  2:06 PM      Failed - This refill cannot be delegated      Passed - Completed PHQ-2 or PHQ-9 in the last  360 days      Passed - Last BP in normal range    BP Readings from Last 1 Encounters:  07/06/24 130/70         Passed - Last Heart Rate in normal range    Pulse Readings from Last 1 Encounters:  10/29/22 87         Passed - Valid encounter within last 6 months    Recent Outpatient Visits           2 months ago Influenza vaccine administered   [place] [facility] [place], Angeline ORN, NP   7 months ago Pain and swelling of right wrist   [place] [facility] [place], Angeline ORN, NP   7 months ago Primary hypertension   [place] [facility] [place], Angeline ORN, NP              Signed Prescriptions Disp Refills   lisinopril  (ZESTRIL ) 20 MG tablet 90 tablet 0    Sig: Take 1 tablet (20 mg total) by mouth daily.     Cardiovascular:  ACE Inhibitors Passed - 09/21/2024  2:06 PM      Passed - Cr in normal range and within 180 days    Creat  Date Value Ref Range Status  07/06/2024 1.16 0.60 - 1.26 mg/dL Final         Passed - K in normal range and within 180 days    Potassium  Date Value Ref Range Status  07/06/2024 4.1 3.5 - 5.3 mmol/L Final         Passed - Patient is not pregnant      Passed - Last BP in normal  range    BP Readings from Last 1 Encounters:  07/06/24 130/70         Passed - Valid encounter within last 6 months    Recent Outpatient Visits           2 months ago Influenza vaccine administered   [place] [facility] [place], Angeline ORN, NP   7 months ago Pain and swelling of right wrist   [place] [facility] [place], Angeline ORN, NP   7 months ago Primary hypertension   [place] [facility] [place], Angeline ORN, [place]

## 2024-09-21 NOTE — Telephone Encounter (Signed)
 Requested Prescriptions  Pending Prescriptions Disp Refills   atomoxetine  (STRATTERA ) 60 MG capsule 30 capsule 0    Sig: Take 1 capsule (60 mg total) by mouth daily.     Not Delegated - Psychiatry: Norepinephrine Reuptake Inhibitor Failed - 09/21/2024  2:05 PM      Failed - This refill cannot be delegated      Passed - Completed PHQ-2 or PHQ-9 in the last 360 days      Passed - Last BP in normal range    BP Readings from Last 1 Encounters:  07/06/24 130/70         Passed - Last Heart Rate in normal range    Pulse Readings from Last 1 Encounters:  10/29/22 87         Passed - Valid encounter within last 6 months    Recent Outpatient Visits           2 months ago Influenza vaccine administered   [facility] [place], [place], NP   7 months ago Pain and swelling of right wrist   [place] [facility] [place], Angeline ORN, NP   7 months ago Primary hypertension   [facility] [place], [place], NP               lisinopril  (ZESTRIL ) 20 MG tablet 90 tablet 0    Sig: Take 1 tablet (20 mg total) by mouth daily.     Cardiovascular:  ACE Inhibitors Passed - 09/21/2024  2:05 PM      Passed - Cr in normal range and within 180 days    Creat  Date Value Ref Range Status  07/06/2024 1.16 0.60 - 1.26 mg/dL Final         Passed - K in normal range and within 180 days    Potassium  Date Value Ref Range Status  07/06/2024 4.1 3.5 - 5.3 mmol/L Final         Passed - Patient is not pregnant      Passed - Last BP in normal range    BP Readings from Last 1 Encounters:  07/06/24 130/70         Passed - Valid encounter within last 6 months    Recent Outpatient Visits           2 months ago Influenza vaccine administered   [place] [facility] [place], Angeline ORN, NP   7 months ago Pain and swelling of right wrist   [place] [facility] [place], Angeline ORN, NP   7 months ago Primary  hypertension   [place] [facility] [place], Angeline ORN, [place]

## 2024-09-30 ENCOUNTER — Other Ambulatory Visit: Payer: Self-pay | Admitting: Internal Medicine

## 2024-10-01 NOTE — Telephone Encounter (Signed)
 Requested medication (s) are due for refill today: No  Requested medication (s) are on the active medication list: Yes  Last refill:  09/21/24  Future visit scheduled: Yes  Notes to clinic:  Not delegated.    Requested Prescriptions  Pending Prescriptions Disp Refills   atomoxetine  (STRATTERA ) 60 MG capsule [Pharmacy Med Name: ATOMOXETINE  60MG  CAPSULES] 30 capsule 0    Sig: TAKE 1 CAPSULE(60 MG) BY MOUTH DAILY     Not Delegated - Psychiatry: Norepinephrine Reuptake Inhibitor Failed - 10/01/2024  3:15 PM      Failed - This refill cannot be delegated      Passed - Completed PHQ-2 or PHQ-9 in the last 360 days      Passed - Last BP in normal range    BP Readings from Last 1 Encounters:  07/06/24 130/70         Passed - Last Heart Rate in normal range    Pulse Readings from Last 1 Encounters:  10/29/22 87         Passed - Valid encounter within last 6 months    Recent Outpatient Visits           2 months ago Influenza vaccine administered   [facility] [place], Angeline ORN, NP   7 months ago Pain and swelling of right wrist   [place] [facility] [place], Angeline ORN, NP   8 months ago Primary hypertension   [facility] [place], Angeline ORN, [place]

## 2025-02-08 ENCOUNTER — Ambulatory Visit: Admitting: Internal Medicine
